# Patient Record
Sex: Male | Born: 1985 | Race: Black or African American | Hispanic: No | Marital: Single | Smoking: Current every day smoker
Health system: Southern US, Community
[De-identification: ages and names within clinical notes are randomized; demographics above are authoritative.]

## PROBLEM LIST (undated history)

## (undated) ENCOUNTER — Ambulatory Visit (HOSPITAL_COMMUNITY): Admission: EM

---

## 2011-10-24 ENCOUNTER — Emergency Department (HOSPITAL_COMMUNITY)
Admission: EM | Admit: 2011-10-24 | Discharge: 2011-10-24 | Disposition: A | Discharge status: Home / Self Care | Payer: Self-pay | Attending: Emergency Medicine | Admitting: Emergency Medicine

## 2011-10-24 ENCOUNTER — Encounter (HOSPITAL_COMMUNITY): Payer: Self-pay | Admitting: Emergency Medicine

## 2011-10-24 DIAGNOSIS — R369 Urethral discharge, unspecified: Secondary | ICD-10-CM | POA: Insufficient documentation

## 2011-10-24 DIAGNOSIS — R300 Dysuria: Secondary | ICD-10-CM | POA: Insufficient documentation

## 2011-10-24 DIAGNOSIS — R109 Unspecified abdominal pain: Secondary | ICD-10-CM | POA: Insufficient documentation

## 2011-10-24 LAB — URINALYSIS, ROUTINE W REFLEX MICROSCOPIC
Bilirubin Urine: NEGATIVE
Ketones, ur: NEGATIVE mg/dL
Nitrite: NEGATIVE
Protein, ur: NEGATIVE mg/dL
Urobilinogen, UA: 1 mg/dL (ref 0.0–1.0)

## 2011-10-24 LAB — URINE MICROSCOPIC-ADD ON

## 2011-10-24 MED ORDER — CEFTRIAXONE SODIUM 250 MG IJ SOLR
250.0000 mg | Freq: Once | INTRAMUSCULAR | Status: AC
  Administered 2011-10-24: 250 mg via INTRAMUSCULAR
  Filled 2011-10-24: qty 250

## 2011-10-24 MED ORDER — AZITHROMYCIN 250 MG PO TABS
1000.0000 mg | ORAL_TABLET | Freq: Once | ORAL | Status: AC
  Administered 2011-10-24: 1000 mg via ORAL
  Filled 2011-10-24: qty 4

## 2011-10-24 NOTE — ED Provider Notes (Signed)
History     CSN: [id number]  Arrival date & time 10/24/11  0302   First MD Initiated Contact with Patient 10/24/11 0345      Chief Complaint  Patient presents with  . Groin Pain    (Consider location/radiation/quality/duration/timing/severity/associated sxs/prior treatment) The history is provided by the patient.   chief complaint is penile discharge. Onset yesterday. Patient is sexually active with multiple partners and has recently had unprotected sex. He denies any rash. He denies any testicle pain. He denies any groin pain. He denies any history of STDs. He has had some dysuria today but no blood in his urine. No joint pain or sore throat. No chest pain, shortness of breath or cough. No abdominal pain or back pain. No fevers or chills. No urgency or frequency. Moderate in severity. No pain or radiation. Intermittent symptoms continuous since onset  History reviewed. No pertinent past medical history.  History reviewed. No pertinent past surgical history.  History reviewed. No pertinent family history.  History  Substance Use Topics  . Smoking status: Current Everyday Smoker  . Smokeless tobacco: Not on file  . Alcohol Use:       Review of Systems  Constitutional: Negative for fever and chills.  HENT: Negative for neck pain and neck stiffness.   Eyes: Negative for pain.  Respiratory: Negative for shortness of breath.   Cardiovascular: Negative for chest pain, palpitations and leg swelling.  Gastrointestinal: Negative for abdominal pain.  Genitourinary: Positive for discharge. Negative for dysuria, frequency, hematuria, flank pain, scrotal swelling, penile pain and testicular pain.  Musculoskeletal: Negative for back pain.  Skin: Negative for rash.  Neurological: Negative for headaches.  All other systems reviewed and are negative.    Allergies  Review of patient's allergies indicates no known allergies.  Home Medications  No current outpatient prescriptions on  file.  BP 156/95  Pulse 73  Temp(Src) 97.6 F (36.4 C) (Oral)  Resp 20  SpO2 99%  Physical Exam  Constitutional: He is oriented to person, place, and time. He appears well-developed and well-nourished.  HENT:  Head: Normocephalic and atraumatic.  Eyes: Conjunctivae and EOM are normal. Pupils are equal, round, and reactive to light.  Neck: Trachea normal. Neck supple. No thyromegaly present.  Cardiovascular: Normal rate, regular rhythm, S1 normal, S2 normal and normal pulses.     No systolic murmur is present   No diastolic murmur is present  Pulses:      Radial pulses are 2+ on the right side, and 2+ on the left side.  Pulmonary/Chest: Effort normal and breath sounds normal. He has no wheezes. He has no rhonchi. He has no rales. He exhibits no tenderness.  Abdominal: Soft. Normal appearance and bowel sounds are normal. There is no tenderness. There is no CVA tenderness and negative Murphy's sign.  Genitourinary: Penis normal. No penile tenderness.       No rash or lesions. No testicle tenderness. No swelling. Circumcised.  Musculoskeletal:       BLE:s Calves nontender, no cords or erythema, negative Homans sign  Neurological: He is alert and oriented to person, place, and time. He has normal strength. No cranial nerve deficit or sensory deficit. GCS eye subscore is 4. GCS verbal subscore is 5. GCS motor subscore is 6.  Skin: Skin is warm and dry. No rash noted. He is not diaphoretic.  Psychiatric: His speech is normal.       Cooperative and appropriate    ED Course  Procedures (including critical care  time)  GC and Chlamydia probe sent. RPR. IM Rocephin. Azithromycin 1 g by mouth.  1. Penile discharge    Safe sex precautions verbalized is understood.   MDM   Penile discharge likely STD. Plan followup with health department 2 days. Patient agrees to notify all recent sexual contacts to be tested. Still for discharge home. All questions answered.        Sunnie Nielsen,  MD 10/24/11 [phone]

## 2011-10-24 NOTE — ED Notes (Signed)
Pt sts he has some pain when he urinates and has noted some discharge as well. Pt sts he noted this yesterday. Pt had unprotected sex over the weekend. Pt denies any abd pain or n/v/d.

## 2011-10-26 NOTE — ED Notes (Addendum)
+   Gonorrhea. Treated with Rocephin and Zithromax. Per protocol MD. [facility] faxed.

## 2011-10-27 NOTE — ED Notes (Signed)
Called and informed patient. 

## 2012-02-28 ENCOUNTER — Emergency Department (HOSPITAL_COMMUNITY)
Admission: EM | Admit: 2012-02-28 | Discharge: 2012-02-28 | Disposition: A | Discharge status: Home / Self Care | Payer: Self-pay | Attending: Emergency Medicine | Admitting: Emergency Medicine

## 2012-02-28 ENCOUNTER — Encounter (HOSPITAL_COMMUNITY): Payer: Self-pay | Admitting: Emergency Medicine

## 2012-02-28 ENCOUNTER — Emergency Department (HOSPITAL_COMMUNITY): Payer: Self-pay

## 2012-02-28 DIAGNOSIS — N453 Epididymo-orchitis: Secondary | ICD-10-CM | POA: Insufficient documentation

## 2012-02-28 DIAGNOSIS — N451 Epididymitis: Secondary | ICD-10-CM

## 2012-02-28 DIAGNOSIS — A64 Unspecified sexually transmitted disease: Secondary | ICD-10-CM | POA: Insufficient documentation

## 2012-02-28 LAB — SYPHILIS: RPR W/REFLEX TO RPR TITER AND TREPONEMAL ANTIBODIES, TRADITIONAL SCREENING AND DIAGNOSIS ALGORITHM: RPR Ser Ql: NONREACTIVE

## 2012-02-28 MED ORDER — AZITHROMYCIN 250 MG PO TABS
1000.0000 mg | ORAL_TABLET | Freq: Once | ORAL | Status: AC
  Administered 2012-02-28: 1000 mg via ORAL
  Filled 2012-02-28: qty 1

## 2012-02-28 MED ORDER — DOXYCYCLINE HYCLATE 100 MG PO CAPS: 100.0000 mg | ORAL_CAPSULE | Freq: Two times a day (BID) | ORAL | Status: AC

## 2012-02-28 MED ORDER — LIDOCAINE HCL (PF) 1 % IJ SOLN
INTRAMUSCULAR | Status: AC
  Administered 2012-02-28: 04:00:00
  Filled 2012-02-28: qty 5

## 2012-02-28 MED ORDER — CEFTRIAXONE SODIUM 250 MG IJ SOLR
250.0000 mg | Freq: Once | INTRAMUSCULAR | Status: AC
  Administered 2012-02-28: 250 mg via INTRAMUSCULAR
  Filled 2012-02-28: qty 250

## 2012-02-28 NOTE — ED Provider Notes (Signed)
History     CSN: [id number]  Arrival date & time 02/28/12  0228   First MD Initiated Contact with Patient 02/28/12 0234      Chief Complaint  Patient presents with  . Penile Discharge    (Consider location/radiation/quality/duration/timing/severity/associated sxs/prior treatment) HPI History provided by patient. Having penile discharge with some testicular discomfort. States sexual partner was diagnosed with gonorrhea. No history of same. No rash. No joint pains. No sore throat. No fevers or chills. No history of diabetes or medical problems otherwise. Symptoms moderate in severity. No dysuria urgency or frequency. History reviewed. No pertinent past medical history.  History reviewed. No pertinent past surgical history.  No family history on file.  History  Substance Use Topics  . Smoking status: Current Everyday Smoker  . Smokeless tobacco: Not on file  . Alcohol Use: Yes      Review of Systems  Constitutional: Negative for fever and chills.  HENT: Negative for neck pain and neck stiffness.   Eyes: Negative for pain.  Respiratory: Negative for shortness of breath.   Cardiovascular: Negative for chest pain.  Gastrointestinal: Negative for abdominal pain.  Genitourinary: Positive for discharge and testicular pain. Negative for dysuria and flank pain.  Musculoskeletal: Negative for back pain.  Skin: Negative for rash.  Neurological: Negative for headaches.  All other systems reviewed and are negative.    Allergies  Review of patient's allergies indicates no known allergies.  Home Medications  No current outpatient prescriptions on file.  BP 163/95  Pulse 71  Temp(Src) 98.5 F (36.9 C) (Oral)  Resp 16  SpO2 100%  Physical Exam  Constitutional: He is oriented to person, place, and time. He appears well-developed and well-nourished.  HENT:  Head: Normocephalic and atraumatic.  Eyes: Conjunctivae and EOM are normal. Pupils are equal, round, and reactive to  light.  Neck: Trachea normal. Neck supple. No thyromegaly present.  Cardiovascular: Normal rate, regular rhythm, S1 normal, S2 normal and normal pulses.     No systolic murmur is present   No diastolic murmur is present  Pulses:      Radial pulses are 2+ on the right side, and 2+ on the left side.  Pulmonary/Chest: Effort normal and breath sounds normal. He has no wheezes. He has no rhonchi. He has no rales. He exhibits no tenderness.  Abdominal: Soft. Normal appearance and bowel sounds are normal. There is no tenderness. There is no CVA tenderness and negative Murphy's sign.  Genitourinary:       Penile discharge. No rash. No lesions. No testicular fullness or tenderness  Musculoskeletal:       BLE:s Calves nontender, no cords or erythema, negative Homans sign  Neurological: He is alert and oriented to person, place, and time. He has normal strength. No cranial nerve deficit or sensory deficit. GCS eye subscore is 4. GCS verbal subscore is 5. GCS motor subscore is 6.  Skin: Skin is warm and dry. No rash noted. He is not diaphoretic.  Psychiatric: His speech is normal.       Cooperative and appropriate    ED Course  Procedures (including critical care time)  Results for orders placed during the hospital encounter of 02/28/12  RPR      Component Value Range   RPR NON REACTIVE  NON REACTIVE    US Scrotum  02/28/2012  *RADIOLOGY REPORT*  Clinical Data: Left testicular pain for 1 day.  ULTRASOUND OF SCROTUM  Technique:  Complete ultrasound examination of the testicles, epididymis, and  other scrotal structures was performed.  Comparison:  None.  Findings:  Right testis:  The right testis measures 4.3 x 2.9 x 3.1 cm.  No focal testicular mass lesions. There is normal homogeneous color flow.  Arterial and venous spectral flow velocity waveforms are obtained.  Left testis:  Left testis measures 4 x 2.7 x 3.2 cm.  No focal testicular mass lesions.  Normal homogeneous color flow.  Arterial and  venous spectral flow velocity waveforms are obtained.  Right epididymis:  Small epididymal cyst measuring 6 mm diameter. Normal epididymal size and flow.  Left epididymis:  Epididymal cyst measuring about 1 cm diameter. The tail of the epididymis is enlarged and heterogeneous in appearance with increased flow.  This may represent focal epididymitis or focal trauma.  Hydrocele:  No significant hydroceles.  Varicocele:  No significant varicoceles.  IMPRESSION: Normal appearance of the testes without evidence of mass, torsion, or inflammation.  Small bilateral epididymal cysts.  Heterogeneous thickening of the distal left epididymis with increased flow which might represent focal epididymitis or possibly post-traumatic change.  Original Report Authenticated By: Marlon Pel, M.D.      MDM   Penile discharge with STD exposure gonorrhea. Treated for same. IM Rocephin and by mouth azithromycin. Referred to health department for followup of RPR testing and further evaluation. STD precautions verbalized as understood.   Doxycycline provided for epididymitis.     Sunnie Nielsen, MD 02/29/12 [phone]
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
## 2012-02-28 NOTE — Discharge Instructions (Signed)
Sexually Transmitted Disease  Sexually transmitted disease (STD) refers to any infection that is passed from person to person during sexual activity. This may happen by way of saliva, semen, blood, vaginal mucus, or urine. Common STDs include:  Gonorrhea.  Chlamydia.  Syphilis.  HIV/AIDS.  Genital herpes.  Hepatitis B and C.  Trichomonas.  Human papillomavirus (HPV).  Pubic lice.  CAUSES  An STD may be spread by bacteria, virus, or parasite. A person can get an STD by:  Sexual intercourse with an infected person.  Sharing sex toys with an infected person.  Sharing needles with an infected person.  Having intimate contact with the genitals, mouth, or rectal areas of an infected person.  SYMPTOMS  Some people may not have any symptoms, but they can still pass the infection to others. Different STDs have different symptoms. Symptoms include:  Painful or bloody urination.  Pain in the pelvis, abdomen, vagina, anus, throat, or eyes.  Skin rash, itching, irritation, growths, or sores (lesions). These usually occur in the genital or anal area.  Abnormal vaginal discharge.  Penile discharge in men.  Soft, flesh-colored skin growths in the genital or anal area.  Fever.  Pain or bleeding during sexual intercourse.  Swollen glands in the groin area.  Yellow skin and eyes (jaundice). This is seen with hepatitis.  DIAGNOSIS  To make a diagnosis, your caregiver may:  Take a medical history.  Perform a physical exam.  Take a specimen (culture) to be examined.  Examine a sample of discharge under a microscope.  Perform blood tests.  Perform a Pap test, if this applies.  Perform a colposcopy.  Perform a laparoscopy.  TREATMENT  Chlamydia, gonorrhea, trichomonas, and syphilis can be cured with antibiotic medicine.  Genital herpes, hepatitis, and HIV can be treated, but not cured, with prescribed medicines. The medicines will lessen the symptoms.  Genital warts from HPV can be treated with  medicine or by freezing, burning (electrocautery), or surgery. Warts may come back.  HPV is a virus and cannot be cured with medicine or surgery. However, abnormal areas may be followed very closely by your caregiver and may be removed from the cervix, vagina, or vulva through office procedures or surgery.  If your diagnosis is confirmed, your recent sexual partners need treatment. This is true even if they are symptom-free or have a negative culture or evaluation. They should not have sex until their caregiver says it is okay.  HOME CARE INSTRUCTIONS  All sexual partners should be informed, tested, and treated for all STDs.  Take your antibiotics as directed. Finish them even if you start to feel better.  Only take over-the-counter or prescription medicines for pain, discomfort, or fever as directed by your caregiver.  Rest.  Eat a balanced diet and drink enough fluids to keep your urine clear or pale yellow.  Do not have sex until treatment is completed and you have followed up with your caregiver. STDs should be checked after treatment.  Keep all follow-up appointments, Pap tests, and blood tests as directed by your caregiver.  Only use latex condoms and water-soluble lubricants during sexual activity. Do not use petroleum jelly or oils.  Avoid alcohol and illegal drugs.  Get vaccinated for HPV and hepatitis. If you have not received these vaccines in the past, talk to your caregiver about whether one or both might be right for you.  Avoid risky sex practices that can break the skin.  The only way to avoid getting an STD is  to avoid all sexual activity. Latex condoms and dental dams (for oral sex) will help lessen the risk of getting an STD, but will not completely eliminate the risk.  SEEK MEDICAL CARE IF:  You have a fever.  You have any new or worsening symptoms. Epididymitis Epididymitis is a swelling (inflammation) of the epididymis. The epididymis is a cord-like structure along the back  part of the testicle. Epididymitis is usually, but not always, caused by infection. This is usually a sudden problem beginning with chills, fever and pain behind the scrotum and in the testicle. There may be swelling and redness of the testicle. DIAGNOSIS  Physical examination will reveal a tender, swollen epididymis. Sometimes, cultures are obtained from the urine or from prostate secretions to help find out if there is an infection or if the cause is a different problem. Sometimes, blood work is performed to see if your white blood cell count is elevated and if a germ (bacterial) or viral infection is present. Using this knowledge, an appropriate medicine which kills germs (antibiotic) can be chosen by your caregiver. A viral infection causing epididymitis will most often go away (resolve) without treatment. HOME CARE INSTRUCTIONS  Hot sitz baths for 20 minutes, 4 times per day, may help relieve pain.  Only take over-the-counter or prescription medicines for pain, discomfort or fever as directed by your caregiver.  Take all medicines, including antibiotics, as directed. Take the antibiotics for the full prescribed length of time even if you are feeling better.  It is very important to keep all follow-up appointments.  SEEK IMMEDIATE MEDICAL CARE IF:  You have a fever.  You have pain not relieved with medicines.  You have any worsening of your problems.  Your pain seems to come and go.  You develop pain, redness, and swelling in the scrotum and surrounding areas.  MAKE SURE YOU:  Understand these instructions.  Will watch your condition.  Will get help right away if you are not doing well or get worse.  Document Released: 09/13/2000 Document Revised: 09/05/2011 Document Reviewed: 08/03/2009 [facility] Patient Information 2012 [place], [place].

## 2012-03-02 LAB — GC/CHLAMYDIA PROBE AMP, GENITAL
Chlamydia, DNA Probe: NEGATIVE
GC Probe Amp, Genital: POSITIVE — AB

## 2012-03-03 NOTE — ED Notes (Signed)
+   Gonorrhea Patient treated with Rocephin and Zithromax/Doxycycline-[facility] letter faxed

## 2012-03-07 NOTE — ED Notes (Signed)
Left voicemail for patient to call back. 

## 2012-03-08 NOTE — ED Notes (Signed)
Attempted to call patient x2. Left voicemail for patient to call back. Sent letter after no answer x3.

## 2013-10-28 ENCOUNTER — Emergency Department (HOSPITAL_COMMUNITY): Payer: Self-pay | Admitting: Anesthesiology

## 2013-10-28 ENCOUNTER — Encounter (HOSPITAL_COMMUNITY): Payer: Self-pay | Admitting: Anesthesiology

## 2013-10-28 ENCOUNTER — Emergency Department (HOSPITAL_COMMUNITY): Payer: Self-pay

## 2013-10-28 ENCOUNTER — Encounter (HOSPITAL_COMMUNITY): Payer: Self-pay | Admitting: Emergency Medicine

## 2013-10-28 ENCOUNTER — Encounter (HOSPITAL_COMMUNITY)
Admission: EM | Disposition: A | Discharge status: Home / Self Care | Payer: Self-pay | Source: Home / Self Care | Attending: Emergency Medicine

## 2013-10-28 ENCOUNTER — Ambulatory Visit (HOSPITAL_COMMUNITY)
Admission: EM | Admit: 2013-10-28 | Discharge: 2013-10-28 | Disposition: A | Discharge status: Home / Self Care | Payer: Self-pay | Attending: Emergency Medicine | Admitting: Emergency Medicine

## 2013-10-28 DIAGNOSIS — L089 Local infection of the skin and subcutaneous tissue, unspecified: Secondary | ICD-10-CM

## 2013-10-28 DIAGNOSIS — S61411A Laceration without foreign body of right hand, initial encounter: Secondary | ICD-10-CM

## 2013-10-28 DIAGNOSIS — F17200 Nicotine dependence, unspecified, uncomplicated: Secondary | ICD-10-CM | POA: Insufficient documentation

## 2013-10-28 DIAGNOSIS — S61209A Unspecified open wound of unspecified finger without damage to nail, initial encounter: Secondary | ICD-10-CM | POA: Insufficient documentation

## 2013-10-28 HISTORY — PX: I & D EXTREMITY: SHX5045

## 2013-10-28 LAB — CBC
HCT: 46 % (ref 39.0–52.0)
HEMOGLOBIN: 16.7 g/dL (ref 13.0–17.0)
MCH: 33.1 pg (ref 26.0–34.0)
MCHC: 36.3 g/dL — ABNORMAL HIGH (ref 30.0–36.0)
MCV: 91.1 fL (ref 78.0–100.0)
Platelets: 235 10*3/uL (ref 150–400)
RBC: 5.05 MIL/uL (ref 4.22–5.81)
RDW: 12.3 % (ref 11.5–15.5)
WBC: 6.7 10*3/uL (ref 4.0–10.5)

## 2013-10-28 LAB — BASIC METABOLIC PANEL
BUN: 18 mg/dL (ref 6–23)
CALCIUM: 10 mg/dL (ref 8.4–10.5)
CHLORIDE: 100 meq/L (ref 96–112)
CO2: 26 meq/L (ref 19–32)
CREATININE: 1.25 mg/dL (ref 0.50–1.35)
GFR calc Af Amer: >90 mL/min (ref >90)
GFR calc non Af Amer: 79 mL/min — ABNORMAL LOW (ref >90)
GLUCOSE: 99 mg/dL (ref 70–99)
Potassium: 4.2 mEq/L (ref 3.7–5.3)
Sodium: 142 mEq/L (ref 137–147)

## 2013-10-28 SURGERY — IRRIGATION AND DEBRIDEMENT EXTREMITY
Anesthesia: General | Site: Hand | Laterality: Right

## 2013-10-28 MED ORDER — BSS IO SOLN
INTRAOCULAR | Status: AC
  Filled 2013-10-28: qty 30

## 2013-10-28 MED ORDER — FENTANYL CITRATE 0.05 MG/ML IJ SOLN
INTRAMUSCULAR | Status: DC | PRN
  Administered 2013-10-28: 100 ug via INTRAVENOUS
  Administered 2013-10-28 (×3): 50 ug via INTRAVENOUS

## 2013-10-28 MED ORDER — PROMETHAZINE HCL 25 MG/ML IJ SOLN: 6.2500 mg | INTRAMUSCULAR | Status: DC | PRN

## 2013-10-28 MED ORDER — OXYCODONE HCL 5 MG PO TABS
5.0000 mg | ORAL_TABLET | Freq: Once | ORAL | Status: AC | PRN
  Administered 2013-10-28: 5 mg via ORAL

## 2013-10-28 MED ORDER — OXYCODONE HCL 5 MG PO TABS
ORAL_TABLET | ORAL | Status: AC
  Filled 2013-10-28: qty 1

## 2013-10-28 MED ORDER — DEXAMETHASONE SODIUM PHOSPHATE 4 MG/ML IJ SOLN
INTRAMUSCULAR | Status: DC | PRN
  Administered 2013-10-28: 8 mg via INTRAVENOUS

## 2013-10-28 MED ORDER — OXYCODONE HCL 5 MG/5ML PO SOLN: 5.0000 mg | Freq: Once | ORAL | Status: AC | PRN

## 2013-10-28 MED ORDER — PROPOFOL 10 MG/ML IV BOLUS
INTRAVENOUS | Status: AC
  Filled 2013-10-28: qty 20

## 2013-10-28 MED ORDER — OXYCODONE-ACETAMINOPHEN 5-325 MG PO TABS: ORAL_TABLET | ORAL | Status: DC

## 2013-10-28 MED ORDER — AMOXICILLIN-POT CLAVULANATE 875-125 MG PO TABS: 1.0000 | ORAL_TABLET | Freq: Two times a day (BID) | ORAL | Status: DC

## 2013-10-28 MED ORDER — ONDANSETRON HCL 4 MG/2ML IJ SOLN
INTRAMUSCULAR | Status: AC
  Filled 2013-10-28: qty 2

## 2013-10-28 MED ORDER — SODIUM CHLORIDE 0.9 % IV SOLN
INTRAVENOUS | Status: DC | PRN
  Administered 2013-10-28: 21:00:00 via INTRAVENOUS

## 2013-10-28 MED ORDER — MIDAZOLAM HCL 2 MG/2ML IJ SOLN
INTRAMUSCULAR | Status: AC
  Filled 2013-10-28: qty 2

## 2013-10-28 MED ORDER — HYDROMORPHONE HCL PF 1 MG/ML IJ SOLN
0.2500 mg | INTRAMUSCULAR | Status: DC | PRN
  Administered 2013-10-28: 0.5 mg via INTRAVENOUS

## 2013-10-28 MED ORDER — FENTANYL CITRATE 0.05 MG/ML IJ SOLN
INTRAMUSCULAR | Status: AC
  Filled 2013-10-28: qty 5

## 2013-10-28 MED ORDER — MIDAZOLAM HCL 5 MG/5ML IJ SOLN
INTRAMUSCULAR | Status: DC | PRN
  Administered 2013-10-28: 2 mg via INTRAVENOUS

## 2013-10-28 MED ORDER — BUPIVACAINE HCL (PF) 0.25 % IJ SOLN
INTRAMUSCULAR | Status: DC | PRN
  Administered 2013-10-28: 8 mL

## 2013-10-28 MED ORDER — LACTATED RINGERS IV SOLN
INTRAVENOUS | Status: DC | PRN
  Administered 2013-10-28: 20:00:00 via INTRAVENOUS

## 2013-10-28 MED ORDER — DEXAMETHASONE SODIUM PHOSPHATE 4 MG/ML IJ SOLN
INTRAMUSCULAR | Status: AC
  Filled 2013-10-28: qty 2

## 2013-10-28 MED ORDER — LIDOCAINE HCL (CARDIAC) 20 MG/ML IV SOLN
INTRAVENOUS | Status: DC | PRN
  Administered 2013-10-28: 100 mg via INTRAVENOUS

## 2013-10-28 MED ORDER — ONDANSETRON HCL 4 MG/2ML IJ SOLN
INTRAMUSCULAR | Status: DC | PRN
Start: 2013-10-28 — End: 2013-10-29
  Administered 2013-10-28: 4 mg via INTRAVENOUS

## 2013-10-28 MED ORDER — HYDROMORPHONE HCL PF 1 MG/ML IJ SOLN
INTRAMUSCULAR | Status: AC
  Filled 2013-10-28: qty 1

## 2013-10-28 MED ORDER — ARTIFICIAL TEARS OP OINT
TOPICAL_OINTMENT | OPHTHALMIC | Status: DC | PRN
  Administered 2013-10-28: 1 via OPHTHALMIC

## 2013-10-28 MED ORDER — STERILE WATER FOR INJECTION IJ SOLN
INTRAMUSCULAR | Status: AC
  Filled 2013-10-28: qty 10

## 2013-10-28 MED ORDER — PROPOFOL 10 MG/ML IV BOLUS
INTRAVENOUS | Status: DC | PRN
  Administered 2013-10-28: 200 mg via INTRAVENOUS

## 2013-10-28 MED ORDER — SUCCINYLCHOLINE CHLORIDE 20 MG/ML IJ SOLN
INTRAMUSCULAR | Status: AC
  Filled 2013-10-28: qty 1

## 2013-10-28 MED ORDER — BUPIVACAINE HCL (PF) 0.25 % IJ SOLN
INTRAMUSCULAR | Status: AC
  Filled 2013-10-28: qty 30

## 2013-10-28 MED ORDER — VECURONIUM BROMIDE 10 MG IV SOLR
INTRAVENOUS | Status: AC
  Filled 2013-10-28: qty 10

## 2013-10-28 MED ORDER — SODIUM CHLORIDE 0.9 % IV SOLN
3.0000 g | INTRAVENOUS | Status: AC
  Administered 2013-10-28: 3 g via INTRAVENOUS
  Filled 2013-10-28: qty 3

## 2013-10-28 MED ORDER — ARTIFICIAL TEARS OP OINT
TOPICAL_OINTMENT | OPHTHALMIC | Status: AC
  Filled 2013-10-28: qty 3.5

## 2013-10-28 MED ORDER — CLINDAMYCIN HCL 150 MG PO CAPS: 600.0000 mg | ORAL_CAPSULE | Freq: Once | ORAL | Status: DC

## 2013-10-28 MED ORDER — SODIUM CHLORIDE 0.9 % IR SOLN
Status: DC | PRN
  Administered 2013-10-28: 3000 mL

## 2013-10-28 SURGICAL SUPPLY — 51 items
BANDAGE COBAN STERILE 2 (GAUZE/BANDAGES/DRESSINGS) IMPLANT
BANDAGE CONFORM 2  STR LF (GAUZE/BANDAGES/DRESSINGS) IMPLANT
BANDAGE ELASTIC 3 VELCRO ST LF (GAUZE/BANDAGES/DRESSINGS) ×3 IMPLANT
BANDAGE ELASTIC 4 VELCRO ST LF (GAUZE/BANDAGES/DRESSINGS) IMPLANT
BANDAGE GAUZE ELAST BULKY 4 IN (GAUZE/BANDAGES/DRESSINGS) ×3 IMPLANT
BNDG COHESIVE 1X5 TAN STRL LF (GAUZE/BANDAGES/DRESSINGS) IMPLANT
BNDG ESMARK 4X9 LF (GAUZE/BANDAGES/DRESSINGS) ×3 IMPLANT
CLOTH BEACON ORANGE TIMEOUT ST (SAFETY) ×3 IMPLANT
CORDS BIPOLAR (ELECTRODE) ×3 IMPLANT
COVER SURGICAL LIGHT HANDLE (MISCELLANEOUS) ×3 IMPLANT
DECANTER SPIKE VIAL GLASS SM (MISCELLANEOUS) IMPLANT
DRAIN PENROSE 1/4X12 LTX STRL (WOUND CARE) IMPLANT
DRSG ADAPTIC 3X8 NADH LF (GAUZE/BANDAGES/DRESSINGS) IMPLANT
DRSG EMULSION OIL 3X3 NADH (GAUZE/BANDAGES/DRESSINGS) IMPLANT
DRSG PAD ABDOMINAL 8X10 ST (GAUZE/BANDAGES/DRESSINGS) IMPLANT
GAUZE PACKING IODOFORM 1/4X5 (PACKING) ×3 IMPLANT
GAUZE XEROFORM 1X8 LF (GAUZE/BANDAGES/DRESSINGS) IMPLANT
GLOVE BIO SURGEON STRL SZ7.5 (GLOVE) ×3 IMPLANT
GLOVE BIOGEL PI IND STRL 8 (GLOVE) ×1 IMPLANT
GLOVE BIOGEL PI INDICATOR 8 (GLOVE) ×2
GOWN BRE IMP PREV XXLGXLNG (GOWN DISPOSABLE) ×3 IMPLANT
HANDPIECE INTERPULSE COAX TIP (DISPOSABLE)
KIT BASIN OR (CUSTOM PROCEDURE TRAY) ×3 IMPLANT
KIT ROOM TURNOVER OR (KITS) ×3 IMPLANT
LOOP VESSEL MAXI BLUE (MISCELLANEOUS) ×3 IMPLANT
LOOP VESSEL MINI RED (MISCELLANEOUS) IMPLANT
MANIFOLD NEPTUNE II (INSTRUMENTS) ×3 IMPLANT
NEEDLE HYPO 25X1 1.5 SAFETY (NEEDLE) IMPLANT
NS IRRIG 1000ML POUR BTL (IV SOLUTION) ×3 IMPLANT
PACK ORTHO EXTREMITY (CUSTOM PROCEDURE TRAY) ×3 IMPLANT
PAD ARMBOARD 7.5X6 YLW CONV (MISCELLANEOUS) ×6 IMPLANT
SCRUB BETADINE 4OZ XXX (MISCELLANEOUS) ×3 IMPLANT
SET HNDPC FAN SPRY TIP SCT (DISPOSABLE) IMPLANT
SOLUTION BETADINE 4OZ (MISCELLANEOUS) ×3 IMPLANT
SPLINT FIBERGLASS 3X12 (CAST SUPPLIES) ×3 IMPLANT
SPONGE GAUZE 4X4 12PLY (GAUZE/BANDAGES/DRESSINGS) IMPLANT
SPONGE LAP 18X18 X RAY DECT (DISPOSABLE) ×3 IMPLANT
SPONGE LAP 4X18 X RAY DECT (DISPOSABLE) IMPLANT
SUCTION FRAZIER TIP 10 FR DISP (SUCTIONS) IMPLANT
SUT ETHILON 4 0 PS 2 18 (SUTURE) IMPLANT
SUT MON AB 5-0 P3 18 (SUTURE) IMPLANT
SYR CONTROL 10ML LL (SYRINGE) IMPLANT
TOWEL OR 17X24 6PK STRL BLUE (TOWEL DISPOSABLE) ×3 IMPLANT
TOWEL OR 17X26 10 PK STRL BLUE (TOWEL DISPOSABLE) ×3 IMPLANT
TUBE ANAEROBIC SPECIMEN COL (MISCELLANEOUS) ×3 IMPLANT
TUBE CONNECTING 12'X1/4 (SUCTIONS) ×1
TUBE CONNECTING 12X1/4 (SUCTIONS) ×2 IMPLANT
TUBE FEEDING 5FR 15 INCH (TUBING) IMPLANT
UNDERPAD 30X30 INCONTINENT (UNDERPADS AND DIAPERS) ×3 IMPLANT
WATER STERILE IRR 1000ML POUR (IV SOLUTION) ×3 IMPLANT
YANKAUER SUCT BULB TIP NO VENT (SUCTIONS) ×3 IMPLANT
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
## 2013-10-28 NOTE — ED Notes (Signed)
Patient transported to x-ray. ?

## 2013-10-28 NOTE — Discharge Instructions (Signed)

## 2013-10-28 NOTE — ED Notes (Signed)
Hand Surgeon at bedside.
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
## 2013-10-28 NOTE — ED Notes (Signed)
Jewelry and belongings placed in labeled pt belongings bag.
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
## 2013-10-28 NOTE — Anesthesia Procedure Notes (Signed)
Procedure Name: LMA Insertion Date/Time: 10/28/2013 8:49 PM Performed by: Wray KearnsFOLEY, Seirra Kos A Pre-anesthesia Checklist: Patient identified, Timeout performed, Emergency Drugs available, Suction available and Patient being monitored Patient Re-evaluated:Patient Re-evaluated prior to inductionOxygen Delivery Method: Circle system utilized Preoxygenation: Pre-oxygenation with 100% oxygen Intubation Type: IV induction Ventilation: Mask ventilation without difficulty LMA: LMA inserted LMA Size: 5.0 Tube type: Oral Number of attempts: 1 Placement Confirmation: breath sounds checked- equal and bilateral and positive ETCO2 Tube secured with: Tape Dental Injury: Teeth and Oropharynx as per pre-operative assessment
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
## 2013-10-28 NOTE — OR Nursing (Signed)
Blue vessel loops to right hand as drain

## 2013-10-28 NOTE — Op Note (Signed)
[id number] 

## 2013-10-28 NOTE — ED Notes (Signed)
Antibiotic to be held until Hydrographic surveyorhand surgeon evaluates.
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
## 2013-10-28 NOTE — ED Notes (Signed)
Pt instructed to not have anything to eat or drink.

## 2013-10-28 NOTE — Anesthesia Preprocedure Evaluation (Signed)
Anesthesia Evaluation  Patient identified by MRN, date of birth, ID band Patient awake    Reviewed: Allergy & Precautions, H&P , NPO status , Patient's Chart, lab work & pertinent test results  Airway Mallampati: I TM Distance: >3 FB Neck ROM: Full    Dental   Pulmonary Current Smoker,  breath sounds clear to auscultation        Cardiovascular Rhythm:Regular Rate:Normal     Neuro/Psych    GI/Hepatic   Endo/Other    Renal/GU      Musculoskeletal   Abdominal   Peds  Hematology   Anesthesia Other Findings   Reproductive/Obstetrics                           Anesthesia Physical Anesthesia Plan  ASA: I and emergent  Anesthesia Plan: General   Post-op Pain Management:    Induction: Intravenous  Airway Management Planned: LMA  Additional Equipment:   Intra-op Plan:   Post-operative Plan: Extubation in OR  Informed Consent: I have reviewed the patients History and Physical, chart, labs and discussed the procedure including the risks, benefits and alternatives for the proposed anesthesia with the patient or authorized representative who has indicated his/her understanding and acceptance.     Plan Discussed with: CRNA and Surgeon  Anesthesia Plan Comments:         Anesthesia Quick Evaluation

## 2013-10-28 NOTE — Preoperative (Signed)
Beta Blockers   Reason not to administer Beta Blockers:Not Applicable 

## 2013-10-29 ENCOUNTER — Encounter (HOSPITAL_COMMUNITY): Payer: Self-pay | Admitting: Orthopedic Surgery
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
## 2013-10-31 LAB — WOUND CULTURE: Culture: NO GROWTH

## 2013-11-02 LAB — ANAEROBIC CULTURE

## 2013-11-03 ENCOUNTER — Emergency Department (HOSPITAL_COMMUNITY)
Admission: EM | Admit: 2013-11-03 | Discharge: 2013-11-03 | Disposition: A | Discharge status: Home / Self Care | Payer: MEDICAID | Attending: Emergency Medicine | Admitting: Emergency Medicine

## 2013-11-03 ENCOUNTER — Encounter (HOSPITAL_COMMUNITY): Payer: Self-pay | Admitting: Emergency Medicine

## 2013-11-03 DIAGNOSIS — M25549 Pain in joints of unspecified hand: Secondary | ICD-10-CM | POA: Insufficient documentation

## 2013-11-03 DIAGNOSIS — Z792 Long term (current) use of antibiotics: Secondary | ICD-10-CM | POA: Insufficient documentation

## 2013-11-03 DIAGNOSIS — M79641 Pain in right hand: Secondary | ICD-10-CM

## 2013-11-03 DIAGNOSIS — Z9889 Other specified postprocedural states: Secondary | ICD-10-CM | POA: Insufficient documentation

## 2013-11-03 DIAGNOSIS — F17200 Nicotine dependence, unspecified, uncomplicated: Secondary | ICD-10-CM | POA: Insufficient documentation

## 2013-11-03 MED ORDER — OXYCODONE-ACETAMINOPHEN 5-325 MG PO TABS
2.0000 | ORAL_TABLET | Freq: Once | ORAL | Status: AC
  Administered 2013-11-03: 2 via ORAL
  Filled 2013-11-03: qty 2
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
## 2013-11-03 NOTE — ED Notes (Signed)
PT ambulated with baseline gait; VSS; A&Ox3; no signs of distress; respirations even and unlabored; skin warm and dry; no questions upon discharge.  

## 2013-11-03 NOTE — ED Notes (Signed)
Patient presents stating he had right hand surgery last Thursday and missed his appointment and the office never called back to reschedule for today.  States the dressing is a mess and states his has to take 1 pain pill a day.  States the bone was infected

## 2013-11-03 NOTE — ED Notes (Addendum)
PT reports he hand surgery Thursday by Dr. Merlyn LotKuzma; reports pain in hand. No smell noted or excessive drainage. Pt's hand wrapped in post surgical gauze. Pt denies fevers or chills. Pt instructed to follow up with hand surgeon first thing in AM.

## 2013-11-03 NOTE — Discharge Instructions (Signed)
DO not eat or drink after midnight tonight. You need to call Dr. Merlyn LotKuzma first thing ing the morning to follow up. Return for follow up if you develop fever or chills.

## 2013-11-08 NOTE — ED Provider Notes (Signed)
Medical screening examination/treatment/procedure(s) were performed by non-physician practitioner and as supervising physician I was immediately available for consultation/collaboration.  EKG Interpretation   None        Olson Lucarelli R. Whitleigh Garramone, MD 11/08/13 2047 

## 2014-03-01 ENCOUNTER — Encounter (HOSPITAL_COMMUNITY): Payer: Self-pay | Admitting: Emergency Medicine

## 2014-03-01 ENCOUNTER — Emergency Department (HOSPITAL_COMMUNITY)
Admission: EM | Admit: 2014-03-01 | Discharge: 2014-03-01 | Disposition: A | Discharge status: Home / Self Care | Payer: MEDICAID | Attending: Emergency Medicine | Admitting: Emergency Medicine

## 2014-03-01 DIAGNOSIS — Y92410 Unspecified street and highway as the place of occurrence of the external cause: Secondary | ICD-10-CM | POA: Insufficient documentation

## 2014-03-01 DIAGNOSIS — Z792 Long term (current) use of antibiotics: Secondary | ICD-10-CM | POA: Insufficient documentation

## 2014-03-01 DIAGNOSIS — T148XXA Other injury of unspecified body region, initial encounter: Secondary | ICD-10-CM

## 2014-03-01 DIAGNOSIS — M542 Cervicalgia: Secondary | ICD-10-CM | POA: Insufficient documentation

## 2014-03-01 DIAGNOSIS — F17200 Nicotine dependence, unspecified, uncomplicated: Secondary | ICD-10-CM | POA: Insufficient documentation

## 2014-03-01 DIAGNOSIS — Z791 Long term (current) use of non-steroidal anti-inflammatories (NSAID): Secondary | ICD-10-CM | POA: Insufficient documentation

## 2014-03-01 DIAGNOSIS — Y9389 Activity, other specified: Secondary | ICD-10-CM | POA: Insufficient documentation

## 2014-03-01 DIAGNOSIS — IMO0002 Reserved for concepts with insufficient information to code with codable children: Secondary | ICD-10-CM | POA: Insufficient documentation

## 2014-03-01 MED ORDER — METHOCARBAMOL 500 MG PO TABS: 1000.0000 mg | ORAL_TABLET | Freq: Four times a day (QID) | ORAL | Status: DC

## 2014-03-01 MED ORDER — NAPROXEN 500 MG PO TABS: 500.0000 mg | ORAL_TABLET | Freq: Two times a day (BID) | ORAL | Status: DC

## 2014-03-01 NOTE — ED Provider Notes (Signed)
Medical screening examination/treatment/procedure(s) were performed by non-physician practitioner and as supervising physician I was immediately available for consultation/collaboration.  Dalon Reichart J. Jamiaya Bina, MD 03/01/14 2059 
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
## 2014-03-01 NOTE — ED Notes (Signed)
According to PA, pt walked out a few minutes ago.
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
## 2014-10-23 ENCOUNTER — Emergency Department (HOSPITAL_COMMUNITY)
Admission: EM | Admit: 2014-10-23 | Discharge: 2014-10-23 | Disposition: A | Discharge status: Home / Self Care | Payer: MEDICAID | Attending: Emergency Medicine | Admitting: Emergency Medicine

## 2014-10-23 ENCOUNTER — Encounter (HOSPITAL_COMMUNITY): Payer: Self-pay | Admitting: Emergency Medicine

## 2014-10-23 DIAGNOSIS — L989 Disorder of the skin and subcutaneous tissue, unspecified: Secondary | ICD-10-CM | POA: Insufficient documentation

## 2014-10-23 DIAGNOSIS — Z79899 Other long term (current) drug therapy: Secondary | ICD-10-CM | POA: Insufficient documentation

## 2014-10-23 DIAGNOSIS — Z720 Tobacco use: Secondary | ICD-10-CM | POA: Insufficient documentation

## 2014-10-23 DIAGNOSIS — R220 Localized swelling, mass and lump, head: Secondary | ICD-10-CM

## 2014-10-23 DIAGNOSIS — Z791 Long term (current) use of non-steroidal anti-inflammatories (NSAID): Secondary | ICD-10-CM | POA: Insufficient documentation

## 2014-10-23 DIAGNOSIS — Z792 Long term (current) use of antibiotics: Secondary | ICD-10-CM | POA: Insufficient documentation

## 2014-10-23 MED ORDER — LIDOCAINE-EPINEPHRINE 2 %-1:100000 IJ SOLN
10.0000 mL | Freq: Once | INTRAMUSCULAR | Status: DC
  Filled 2014-10-23: qty 1

## 2014-10-23 MED ORDER — SULFAMETHOXAZOLE-TRIMETHOPRIM 800-160 MG PO TABS: 1.0000 | ORAL_TABLET | Freq: Two times a day (BID) | ORAL | Status: AC

## 2014-10-23 MED ORDER — VALACYCLOVIR HCL 1 G PO TABS: 1000.0000 mg | ORAL_TABLET | Freq: Three times a day (TID) | ORAL | Status: AC

## 2014-10-23 NOTE — Discharge Instructions (Signed)
Take Bactrim twice daily for [redacted] week along with Valtrex 3 times daily for 1 week. Follow-up at the urgent care in 2 days for recheck. Apply warm compresses. Rash A rash is a change in the color or texture of your skin. There are many different types of rashes. You may have other problems that accompany your rash. CAUSES   Infections.  Allergic reactions. This can include allergies to pets or foods.  Certain medicines.  Exposure to certain chemicals, soaps, or cosmetics.  Heat.  Exposure to poisonous plants.  Tumors, both cancerous and noncancerous. SYMPTOMS   Redness.  Scaly skin.  Itchy skin.  Dry or cracked skin.  Bumps.  Blisters.  Pain. DIAGNOSIS  Your caregiver may do a physical exam to determine what type of rash you have. A skin sample (biopsy) may be taken and examined under a microscope. TREATMENT  Treatment depends on the type of rash you have. Your caregiver may prescribe certain medicines. For serious conditions, you may need to see a skin doctor (dermatologist). HOME CARE INSTRUCTIONS   Avoid the substance that caused your rash.  Do not scratch your rash. This can cause infection.  You may take cool baths to help stop itching.  Only take over-the-counter or prescription medicines as directed by your caregiver.  Keep all follow-up appointments as directed by your caregiver. SEEK IMMEDIATE MEDICAL CARE IF:  You have increasing pain, swelling, or redness.  You have a fever.  You have new or severe symptoms.  You have body aches, diarrhea, or vomiting.  Your rash is not better after 3 days. MAKE SURE YOU:  Understand these instructions.  Will watch your condition.  Will get help right away if you are not doing well or get worse. Document Released: 09/06/2002 Document Revised: 12/09/2011 Document Reviewed: 07/01/2011 [facility] Patient Information 2015 [place], [place]. This information is not intended to replace advice given to you by your  health care provider. Make sure you discuss any questions you have with your health care provider.  Herpes Simplex Herpes simplex is generally classified as Type 1 or Type 2. Type 1 is generally the type that is responsible for cold sores. Type 2 is generally associated with sexually transmitted diseases. We now know that most of the thoughts on these viruses are inaccurate. We find that HSV1 is also present genitally and HSV2 can be present orally, but this will vary in different locations of the world. Herpes simplex is usually detected by doing a culture. Blood tests are also available for this virus; however, the accuracy is often not as good.  PREPARATION FOR TEST No preparation or fasting is necessary. NORMAL FINDINGS  No virus present  No HSV antigens or antibodies present Ranges for normal findings may vary among different laboratories and hospitals. You should always check with your doctor after having lab work or other tests done to discuss the meaning of your test results and whether your values are considered within normal limits. MEANING OF TEST  Your caregiver will go over the test results with you and discuss the importance and meaning of your results, as well as treatment options and the need for additional tests if necessary. OBTAINING THE TEST RESULTS  It is your responsibility to obtain your test results. Ask the lab or department performing the test when and how you will get your results. Document Released: 10/19/2004 Document Revised: 12/09/2011 Document Reviewed: 08/27/2008 [facility] Patient Information 2015 [place], [place]. This information is not intended to replace advice given to you  by your health care provider. Make sure you discuss any questions you have with your health care provider. ° °
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
## 2014-10-23 NOTE — ED Notes (Signed)
Pt states he woke up this morning with a bug bite on his face and left arm. Left cheek swollen, with a sore, small amount of clear drainage coming from from sore.

## 2014-12-17 ENCOUNTER — Encounter (HOSPITAL_COMMUNITY): Payer: Self-pay | Admitting: Emergency Medicine

## 2014-12-17 ENCOUNTER — Emergency Department (HOSPITAL_COMMUNITY)
Admission: EM | Admit: 2014-12-17 | Discharge: 2014-12-17 | Disposition: A | Discharge status: Home / Self Care | Payer: Self-pay | Attending: Emergency Medicine | Admitting: Emergency Medicine

## 2014-12-17 DIAGNOSIS — Z720 Tobacco use: Secondary | ICD-10-CM | POA: Insufficient documentation

## 2014-12-17 DIAGNOSIS — K088 Other specified disorders of teeth and supporting structures: Secondary | ICD-10-CM | POA: Insufficient documentation

## 2014-12-17 DIAGNOSIS — K0889 Other specified disorders of teeth and supporting structures: Secondary | ICD-10-CM

## 2014-12-17 DIAGNOSIS — Z791 Long term (current) use of non-steroidal anti-inflammatories (NSAID): Secondary | ICD-10-CM | POA: Insufficient documentation

## 2014-12-17 DIAGNOSIS — Z79899 Other long term (current) drug therapy: Secondary | ICD-10-CM | POA: Insufficient documentation

## 2014-12-17 MED ORDER — BUPIVACAINE HCL 0.25 % IJ SOLN
5.0000 mL | Freq: Once | INTRAMUSCULAR | Status: DC
  Filled 2014-12-17: qty 5

## 2014-12-17 MED ORDER — IBUPROFEN 400 MG PO TABS
800.0000 mg | ORAL_TABLET | Freq: Once | ORAL | Status: AC
  Administered 2014-12-17: 800 mg via ORAL
  Filled 2014-12-17: qty 2

## 2014-12-17 MED ORDER — IBUPROFEN 800 MG PO TABS: 800.0000 mg | ORAL_TABLET | Freq: Three times a day (TID) | ORAL | Status: DC

## 2014-12-17 MED ORDER — BUPIVACAINE-EPINEPHRINE (PF) 0.5% -1:200000 IJ SOLN
1.8000 mL | Freq: Once | INTRAMUSCULAR | Status: AC
  Administered 2014-12-17: 1.8 mL

## 2014-12-17 NOTE — ED Notes (Signed)
Pt c/o dental pain on right side of mouth.  St's unsure of which tooth is causing pain.  C/O pain upper and lower.  Onset 2 days ago

## 2014-12-17 NOTE — Discharge Instructions (Signed)
Take ibuprofen as directed for pain. Your right lower wisdom tooth is coming in. Follow up with the dentist listed above or on the resource attachment.  Dental Pain A tooth ache may be caused by cavities (tooth decay). Cavities expose the nerve of the tooth to air and hot or cold temperatures. It may come from an infection or abscess (also called a boil or furuncle) around your tooth. It is also often caused by dental caries (tooth decay). This causes the pain you are having. DIAGNOSIS  Your caregiver can diagnose this problem by exam. TREATMENT   If caused by an infection, it may be treated with medications which kill germs (antibiotics) and pain medications as prescribed by your caregiver. Take medications as directed.  Only take over-the-counter or prescription medicines for pain, discomfort, or fever as directed by your caregiver.  Whether the tooth ache today is caused by infection or dental disease, you should see your dentist as soon as possible for further care. SEEK MEDICAL CARE IF: The exam and treatment you received today has been provided on an emergency basis only. This is not a substitute for complete medical or dental care. If your problem worsens or new problems (symptoms) appear, and you are unable to meet with your dentist, call or return to this location. SEEK IMMEDIATE MEDICAL CARE IF:   You have a fever.  You develop redness and swelling of your face, jaw, or neck.  You are unable to open your mouth.  You have severe pain uncontrolled by pain medicine. MAKE SURE YOU:   Understand these instructions.  Will watch your condition.  Will get help right away if you are not doing well or get worse. Document Released: 09/16/2005 Document Revised: 12/09/2011 Document Reviewed: 05/04/2008 [facility]Exit[facility] Patient Information 2015 [place]ExitCare, [place]LLC. This information is not intended to replace advice given to you by your health care provider. Make sure you discuss any questions you  have with your health care provider.
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
## 2015-01-01 ENCOUNTER — Encounter (HOSPITAL_COMMUNITY): Payer: Self-pay | Admitting: *Deleted

## 2015-01-01 ENCOUNTER — Emergency Department (HOSPITAL_COMMUNITY)
Admission: EM | Admit: 2015-01-01 | Discharge: 2015-01-01 | Disposition: A | Discharge status: Home / Self Care | Payer: Self-pay | Attending: Emergency Medicine | Admitting: Emergency Medicine

## 2015-01-01 DIAGNOSIS — Z79899 Other long term (current) drug therapy: Secondary | ICD-10-CM | POA: Insufficient documentation

## 2015-01-01 DIAGNOSIS — K047 Periapical abscess without sinus: Secondary | ICD-10-CM | POA: Insufficient documentation

## 2015-01-01 DIAGNOSIS — K0889 Other specified disorders of teeth and supporting structures: Secondary | ICD-10-CM

## 2015-01-01 DIAGNOSIS — Z791 Long term (current) use of non-steroidal anti-inflammatories (NSAID): Secondary | ICD-10-CM | POA: Insufficient documentation

## 2015-01-01 DIAGNOSIS — K088 Other specified disorders of teeth and supporting structures: Secondary | ICD-10-CM | POA: Insufficient documentation

## 2015-01-01 DIAGNOSIS — Z720 Tobacco use: Secondary | ICD-10-CM | POA: Insufficient documentation

## 2015-01-01 MED ORDER — IBUPROFEN 800 MG PO TABS: 800.0000 mg | ORAL_TABLET | Freq: Three times a day (TID) | ORAL | Status: DC | PRN

## 2015-01-01 MED ORDER — PENICILLIN V POTASSIUM 500 MG PO TABS: 500.0000 mg | ORAL_TABLET | Freq: Four times a day (QID) | ORAL | Status: AC

## 2015-01-01 MED ORDER — HYDROCODONE-ACETAMINOPHEN 5-325 MG PO TABS: 1.0000 | ORAL_TABLET | Freq: Four times a day (QID) | ORAL | Status: DC | PRN

## 2015-01-01 NOTE — ED Notes (Signed)
Pt reports right side upper dental pain and swelling x 3 days. Airway intact.

## 2015-01-01 NOTE — ED Notes (Signed)
Declined W/C at D/C and was escorted to lobby by RN. 

## 2015-01-01 NOTE — Discharge Instructions (Signed)
Read the information below.  Use the prescribed medication as directed.  Please discuss all new medications with your pharmacist.  Do not take additional tylenol while taking the prescribed pain medication to avoid overdose.  You may return to the Emergency Department at any time for worsening condition or any new symptoms that concern you.  Please call the dentist listed above within 48 hours to schedule a close follow up appointment.  If you develop fevers, swelling in your face, difficulty swallowing or breathing, return to the ER immediately for a recheck.   ° ° °Dental Abscess °A dental abscess is a collection of infected fluid (pus) from a bacterial infection in the inner part of the tooth (pulp). It usually occurs at the end of the tooth's root.  °CAUSES  °· Severe tooth decay. °· Trauma to the tooth that allows bacteria to enter into the pulp, such as a broken or chipped tooth. °SYMPTOMS  °· Severe pain in and around the infected tooth. °· Swelling and redness around the abscessed tooth or in the mouth or face. °· Tenderness. °· Pus drainage. °· Bad breath. °· Bitter taste in the mouth. °· Difficulty swallowing. °· Difficulty opening the mouth. °· Nausea. °· Vomiting. °· Chills. °· Swollen neck glands. °DIAGNOSIS  °· A medical and dental history will be taken. °· An examination will be performed by tapping on the abscessed tooth. °· X-rays may be taken of the tooth to identify the abscess. °TREATMENT °The goal of treatment is to eliminate the infection. You may be prescribed antibiotic medicine to stop the infection from spreading. A root canal may be performed to save the tooth. If the tooth cannot be saved, it may be pulled (extracted) and the abscess may be drained.  °HOME CARE INSTRUCTIONS °· Only take over-the-counter or prescription medicines for pain, fever, or discomfort as directed by your caregiver. °· Rinse your mouth (gargle) often with salt water (¼ tsp salt in 8 oz [250 ml] of warm water) to  relieve pain or swelling. °· Do not drive after taking pain medicine (narcotics). °· Do not apply heat to the outside of your face. °· Return to your dentist for further treatment as directed. °SEEK MEDICAL CARE IF: °· Your pain is not helped by medicine. °· Your pain is getting worse instead of better. °SEEK IMMEDIATE MEDICAL CARE IF: °· You have a fever or persistent symptoms for more than 2-3 days. °· You have a fever and your symptoms suddenly get worse. °· You have chills or a very bad headache. °· You have problems breathing or swallowing. °· You have trouble opening your mouth. °· You have swelling in the neck or around the eye. °Document Released: 09/16/2005 Document Revised: 06/10/2012 Document Reviewed: 12/25/2010 °ExitCare® Patient Information ©2015 [employer]. This information is not intended to replace advice given to you by your health care provider. Make sure you discuss any questions you have with your health care provider. ° ° °Emergency Department Resource Guide °1) Find a Doctor and Pay Out of Pocket °Although you won't have to find out who is covered by your insurance plan, it is a good idea to ask around and get recommendations. You will then need to call the office and see if the doctor you have chosen will accept you as a new patient and what types of options they offer for patients who are self-pay. Some doctors offer discounts or will set up payment plans for their patients who do not have insurance, but you   will need to ask so you aren't surprised when you get to your appointment. ° °2) Contact Your Local Health Department °Not all health departments have doctors that can see patients for sick visits, but many do, so it is worth a call to see if yours does. If you don't know where your local health department is, you can check in your phone book. The CDC also has a tool to help you locate your state's health department, and many state websites also have listings of all of their local  health departments. ° °3) Find a Walk-in Clinic °If your illness is not likely to be very severe or complicated, you may want to try a walk in clinic. These are popping up all over the country in pharmacies, drugstores, and shopping centers. They're usually staffed by nurse practitioners or physician assistants that have been trained to treat common illnesses and complaints. They're usually fairly quick and inexpensive. However, if you have serious medical issues or chronic medical problems, these are probably not your best option. ° °No Primary Care Doctor: °- Call [facility] at  832-8000 - they can help you locate a primary care doctor that  accepts your insurance, provides certain services, etc. °- Physician Referral Service- 1-800-533-3463 ° °Chronic Pain Problems: °Organization         Address  Phone   Notes  °[facility]  (336) 297-2271 Patients need to be referred by their primary care doctor.  ° °Medication Assistance: °Organization         Address  Phone   Notes  °[place] [facility] [address] °[place], [place] [address] (336) 641-8030 --Must be a resident of [place] °-- Must have NO insurance coverage whatsoever (no Medicaid/ Medicare, etc.) °-- The pt. MUST have a primary care doctor that directs their care regularly and follows them in the community °  °[facility]  (866) 331-1348   °[employer]  (888) 892-1162   ° °Agencies that provide inexpensive medical care: °Organization         Address  Phone   Notes  °[facility]  (336) 832-8035   °[facility]    (336) 832-7272   °[facility] [address] °[place], [place] [address] (336) 832-4777   °[facility] [address], °[place] (336) 271-4999   °[facility]    (336) 373-0678   °[facility]    (336) 272-1050   °[facility] ° [address], [place] Phone:   (336) 832-4444, Fax:  (336) 832-4440 Hours of Operation:  9 am - 6 pm, M-F.  Also accepts Medicaid/Medicare and self-pay.  °[facility] ° [address], [place] Phone: (336) 832-3150, Fax: (336) 832-3151. Hours of Operation:  8:30 am - 5:30 pm, M-F.  Also accepts Medicaid and self-pay.  °[facility] [place] [address], [place] Phone: (336) 878-6027   °[facility] [address], [place], [place] (336)723-1848, Ext. 123 Mondays & Thursdays: 7-9 AM.  First 15 patients are seen on a first come, first serve basis. °  ° °Medicaid-accepting [place] Providers: ° °Organization         Address  Phone   Notes  °[facility] [address], [place] (336) 641-2100 Also accepts self-pay patients.  °[facility] [address],   [place] ° (336) 856-9996   °[facility] [address], [place] (336) 288-8857   °[facility] [address] [place] [address], [place] (336) 299-7000   °Veita Bland [address], [place]  ° (336) 373-1557 Only accepts [place] Access Medicaid patients after they have their name applied to their card.  ° °Self-Pay (no insurance) in [place]: ° °Organization         Address  Phone   Notes  °Sickle Cell Patients, [facility] [address], [place] (336) 832-1970   °[facility] [address], [place] (336) 832-4400   °[facility] ° [address], [place] (336) 992-4800   °[facility]/Dr. Osei-Bonsu ° [address] [place] [address], [place] or [address], [place] (336) 841-8500 Phone number for both [place] and [place] locations is the same.  °[facility] [address], [place] (336) 299-0000   °[facility] [address] [place] [address], [place] or [address] (336)  852-7530 °(336) 878-2260   °[facility] [address], [place] (336) 350-1642, phone; (336) 294-5005, fax Sees patients 1st and 3rd Saturday of every month.  Must not qualify for public or private insurance (i.e. Medicaid, Medicare, [place] Health Choice, Veterans' Benefits) • Household income should be no more than 200% of the poverty level •The clinic cannot treat you if you are pregnant or think you are pregnant • Sexually transmitted diseases are not treated at the clinic.  ° ° °Dental Care: °Organization         Address  Phone  Notes  °[facility] [address], [place] (336) 641-6152 Accepts children up to age 21 who are enrolled in Medicaid or [place] Health Choice; pregnant women with a Medicaid card; and children who have applied for Medicaid or [place] Health Choice, but were declined, whose parents can pay a reduced fee at time of service.  °[facility] [place]  [address], [place] (336) 641-7733 Accepts children up to age 21 who are enrolled in Medicaid or  Health Choice; pregnant women with a Medicaid card; and children who have applied for Medicaid or  Health Choice, but were declined, whose parents can pay a reduced fee at time of service.  °[facility] ° [address], [place] (336) 641-4533 Patients are seen by appointment only. Walk-ins are not accepted. [facility] will see patients 18 years of age and older. °Monday - Tuesday (8am-5pm) °Most Wednesdays (8:30-5pm) °$30 per visit, cash only  °[facility] ° [address], [place] (336) 641-4533 Patients are seen by appointment only. Walk-ins are not accepted. [facility] will see patients 18 years of age and older. °One Wednesday Evening (Monthly: Volunteer Based).  $30 per visit, cash only  °[facility]  (919) 537-3737 for adults;  Children under age 4, call Graduate Pediatric Dentistry at (919) [phone]. Children aged 4-14, please call (919) [phone] to request a pediatric application. ° Dental services are provided in all areas of dental care including fillings, crowns and bridges, complete and partial dentures, implants, gum treatment, root canals, and extractions. Preventive care is also provided. Treatment is provided to both adults and children. °Patients are selected via a lottery and   there is often a waiting list. °  °[facility] [address], °[place] ° (336) 763-8833 [email or web address] °  °[facility] [address], [place], [place] (336)723-1848, Ext. 123 Second and Fourth Thursday of each month, opens at 6:30 AM; Clinic ends at 9 AM.  Patients are seen on a first-come first-served basis, and a limited number are seen during each clinic.  ° °[facility] ° [address], [place], [place] (336) 723-7904   Eligibility Requirements °You must have lived in [place], [place], or [place] counties for at least the last three months. °  You cannot be eligible for state or federal sponsored healthcare insurance, including Veterans Administration, Medicaid, or Medicare. °  You generally cannot be eligible for healthcare insurance through your employer.  °  How to apply: °Eligibility screenings are held every Tuesday and Wednesday afternoon from 1:00 pm until 4:00 pm. You do not need an appointment for the interview!  °[facility] [address], [place], [place] 336-631-2330   °[facility]  336-342-8273   °[facility]  336-703-3100   °[facility]  336-570-6415   ° °Behavioral Health Resources in the Community: °Intensive Outpatient Programs °Organization         Address  Phone  Notes  °[facility] [address], [place], [place] 336-878-6098   °[facility] [address], [place], [place] 336-832-9800   °[facility] [address], [place], [place] ° 336-882-2125   °[facility] [address],  °[place], [place] 1-800-853-5163 or 336-641-4981   °Substance Abuse Resources °Organization         Address  Phone  Notes  °[facility]  336-882-2125   °[facility]  336-784-9470   °[facility]  336-285-9073   °[facility]  336-845-3988   °Residential & Outpatient Substance Abuse Program  1-800-659-3381   °Psychological Services °Organization         Address  Phone  Notes  °[facility]  336- 832-9600   °[facility]  336- 378-7881   °[facility] [address], [place] 1-800-853-5163 or 336-641-4981   ° °Mobile Crisis Teams °Organization         Address  Phone  Notes  °[facility], Mobile Crisis Care Unit  1-877-626-1772   °Assertive °[facility] ° [address] [place], [place] 336-834-9664   °Sharon DeEsch [address] °[place] 336-554-5454   ° °Self-Help/Support Groups °Organization         Address  Phone             Notes  °[facility] - variety of support groups  336- 373-1402 Call for more information  °Narcotics Anonymous (NA), [facility] [address], °[place]  2 meetings at this location  ° °Residential Treatment Programs °Organization         Address  Phone  Notes  °[facility] [address],    °[place]  1-866-801-8205   °[facility] ° [address], [place], [place] 704-293-8524   °[facility] [address], [place] 336-845-3988 Admissions: 8am-3pm M-F  °[facility] [address]    °[place],  336-841-1104   °[facility] [address], [place],  336-379-7146   °  [facility] [address]  °[place], [place] 336-285-9073   °[facility] - Intensive  Outpatient [address], , [place] 336-852-3033   °[facility] [address]  °[place], [place] 1-877-615-2722 or 336-784-9470   °[facility] [address] [place], [place] 336-227-7417 Accepts Medicaid  °[facility] [address]  ° [place] 1-800-659-3381 Substance Abuse/Addiction Treatment  ° °[place] Behavioral Health Resources °Organization         Address  Phone  Notes  °[facility]  (888) 581-9988   °Julie Brannon, PhD [address] [place], [place]   (336) 349-5553 or (336) 951-0000   ° Behavioral   [address] °[place], [place] (336) 349-4454   °[facility] [address], [place], [place] (336) 342-8316 Insurance/Medicaid/sponsorship through [facility]  °[facility] [address]                                    [place], [place] (336) 342-8316 Therapy/tele-psych/case  °[facility] [address]  ° [place], [place] (336) 349-2233    °Dr. Arfeen  (336) 349-4544   °[facility] [place]  [employer] [place] [facility] 1) [address], [place] °2) [address], [place] °3)  [address], [place] (336) 349-3220 °(336) 342-7768 ° °(336) 342-8140   °[place] Child Abuse [facility] (336) 342-1394 or (336) 342-3537 (After Hours)    ° ° ° ° °
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
## 2015-12-14 ENCOUNTER — Emergency Department (HOSPITAL_COMMUNITY)
Admission: EM | Admit: 2015-12-14 | Discharge: 2015-12-14 | Disposition: A | Discharge status: Home / Self Care | Payer: No Typology Code available for payment source | Attending: Emergency Medicine | Admitting: Emergency Medicine

## 2015-12-14 ENCOUNTER — Encounter (HOSPITAL_COMMUNITY): Payer: Self-pay

## 2015-12-14 DIAGNOSIS — K130 Diseases of lips: Secondary | ICD-10-CM | POA: Insufficient documentation

## 2015-12-14 DIAGNOSIS — L0291 Cutaneous abscess, unspecified: Secondary | ICD-10-CM

## 2015-12-14 DIAGNOSIS — F17210 Nicotine dependence, cigarettes, uncomplicated: Secondary | ICD-10-CM | POA: Insufficient documentation

## 2015-12-14 DIAGNOSIS — Z79899 Other long term (current) drug therapy: Secondary | ICD-10-CM | POA: Insufficient documentation

## 2015-12-14 DIAGNOSIS — R220 Localized swelling, mass and lump, head: Secondary | ICD-10-CM

## 2015-12-14 MED ORDER — SULFAMETHOXAZOLE-TRIMETHOPRIM 800-160 MG PO TABS: 2.0000 | ORAL_TABLET | Freq: Two times a day (BID) | ORAL | Status: AC

## 2015-12-14 MED ORDER — LIDOCAINE HCL (PF) 1 % IJ SOLN
5.0000 mL | Freq: Once | INTRAMUSCULAR | Status: AC
  Administered 2015-12-14: 5 mL
  Filled 2015-12-14: qty 5

## 2015-12-14 MED ORDER — CEPHALEXIN 500 MG PO CAPS: 500.0000 mg | ORAL_CAPSULE | Freq: Four times a day (QID) | ORAL | Status: DC

## 2015-12-14 NOTE — ED Notes (Signed)
PA at bedside attempting I&D

## 2015-12-14 NOTE — Discharge Instructions (Signed)
Return to the Emergency Department immediately if you develop any swelling of your tongue or throat or difficulty swallowing

## 2015-12-14 NOTE — ED Notes (Signed)
Patient here with top lip swelling since last night, reports that all the swelling started after messing with a bump to same

## 2015-12-14 NOTE — ED Notes (Signed)
Patient able to ambulate independently  
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
## 2016-11-14 ENCOUNTER — Encounter (HOSPITAL_COMMUNITY): Payer: Self-pay

## 2016-11-14 DIAGNOSIS — Y92410 Unspecified street and highway as the place of occurrence of the external cause: Secondary | ICD-10-CM | POA: Diagnosis not present

## 2016-11-14 DIAGNOSIS — M25521 Pain in right elbow: Secondary | ICD-10-CM | POA: Diagnosis not present

## 2016-11-14 DIAGNOSIS — F17200 Nicotine dependence, unspecified, uncomplicated: Secondary | ICD-10-CM | POA: Diagnosis not present

## 2016-11-14 DIAGNOSIS — Y939 Activity, unspecified: Secondary | ICD-10-CM | POA: Insufficient documentation

## 2016-11-14 DIAGNOSIS — Y999 Unspecified external cause status: Secondary | ICD-10-CM | POA: Insufficient documentation
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
## 2016-11-15 ENCOUNTER — Emergency Department (HOSPITAL_COMMUNITY): Payer: No Typology Code available for payment source

## 2016-11-15 ENCOUNTER — Emergency Department (HOSPITAL_COMMUNITY)
Admission: EM | Admit: 2016-11-15 | Discharge: 2016-11-15 | Disposition: A | Discharge status: Home / Self Care | Payer: No Typology Code available for payment source | Attending: Emergency Medicine | Admitting: Emergency Medicine

## 2016-11-15 DIAGNOSIS — M25521 Pain in right elbow: Secondary | ICD-10-CM

## 2016-11-15 MED ORDER — NAPROXEN 500 MG PO TABS: 500.0000 mg | ORAL_TABLET | Freq: Two times a day (BID) | ORAL | 0 refills | Status: DC

## 2016-11-15 NOTE — Discharge Instructions (Signed)
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
## 2017-03-01 IMAGING — DX DG ELBOW COMPLETE 3+V*R*
4 series · 4 of 4 positions shown · non-contrast
Comparison: None.

CLINICAL DATA: Acute onset of right elbow pain.  Initial encounter.

EXAM:
RIGHT ELBOW - COMPLETE 3+ VIEW

[elbow ap]
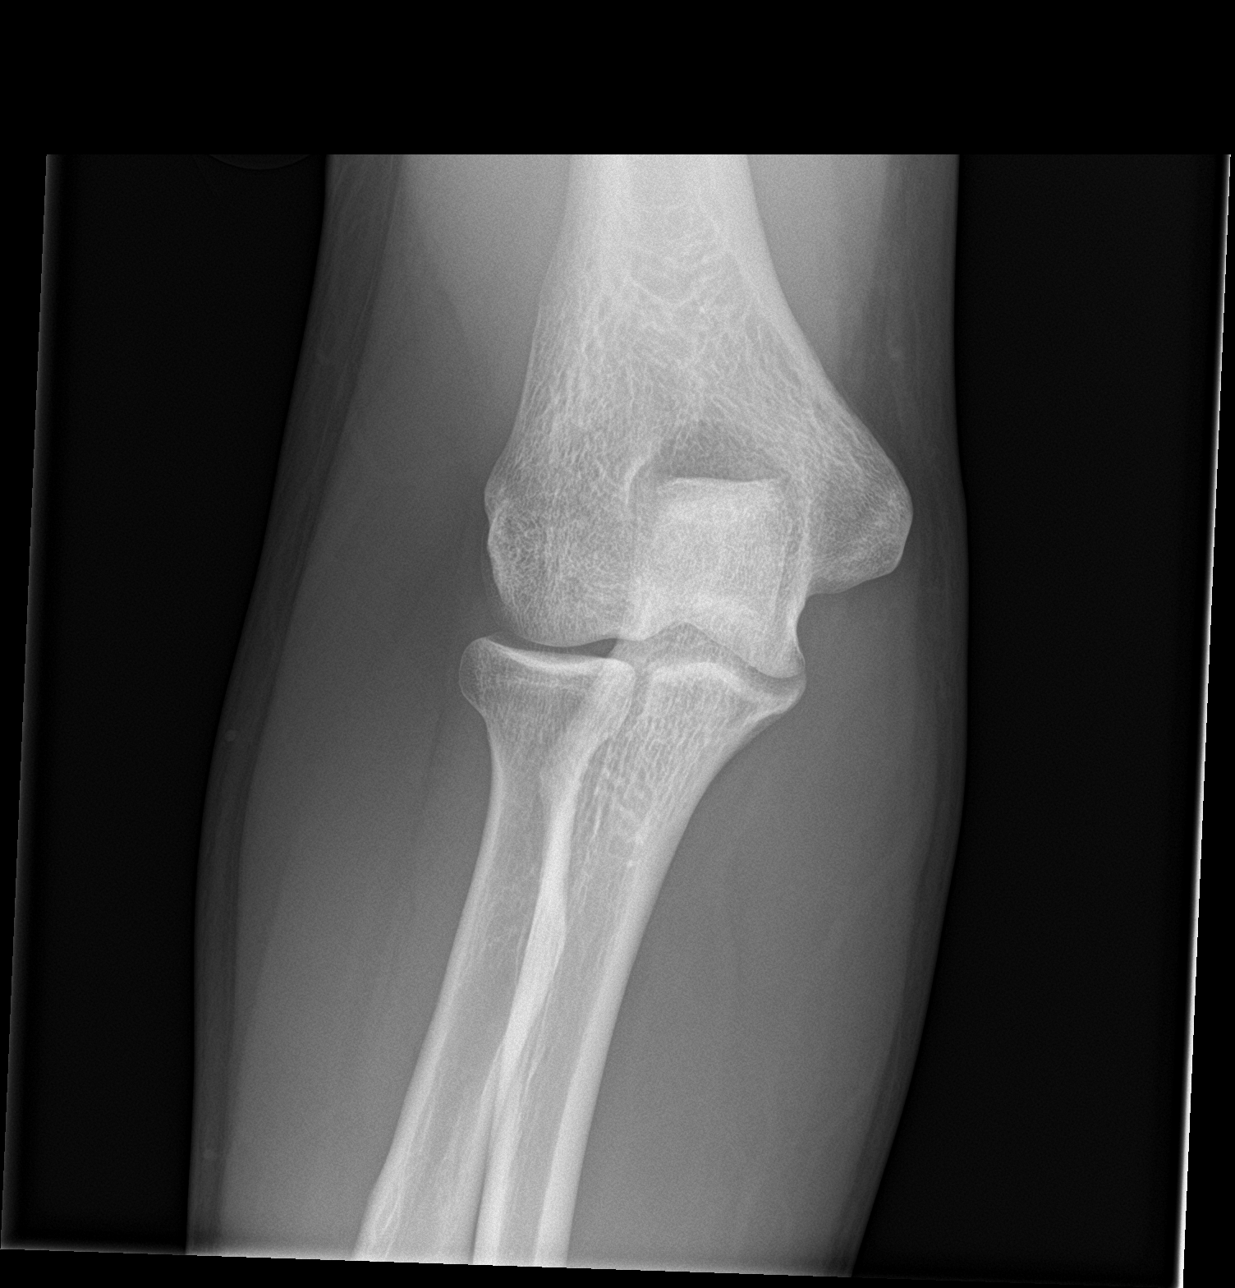

[elbow obl (1 of 2)]
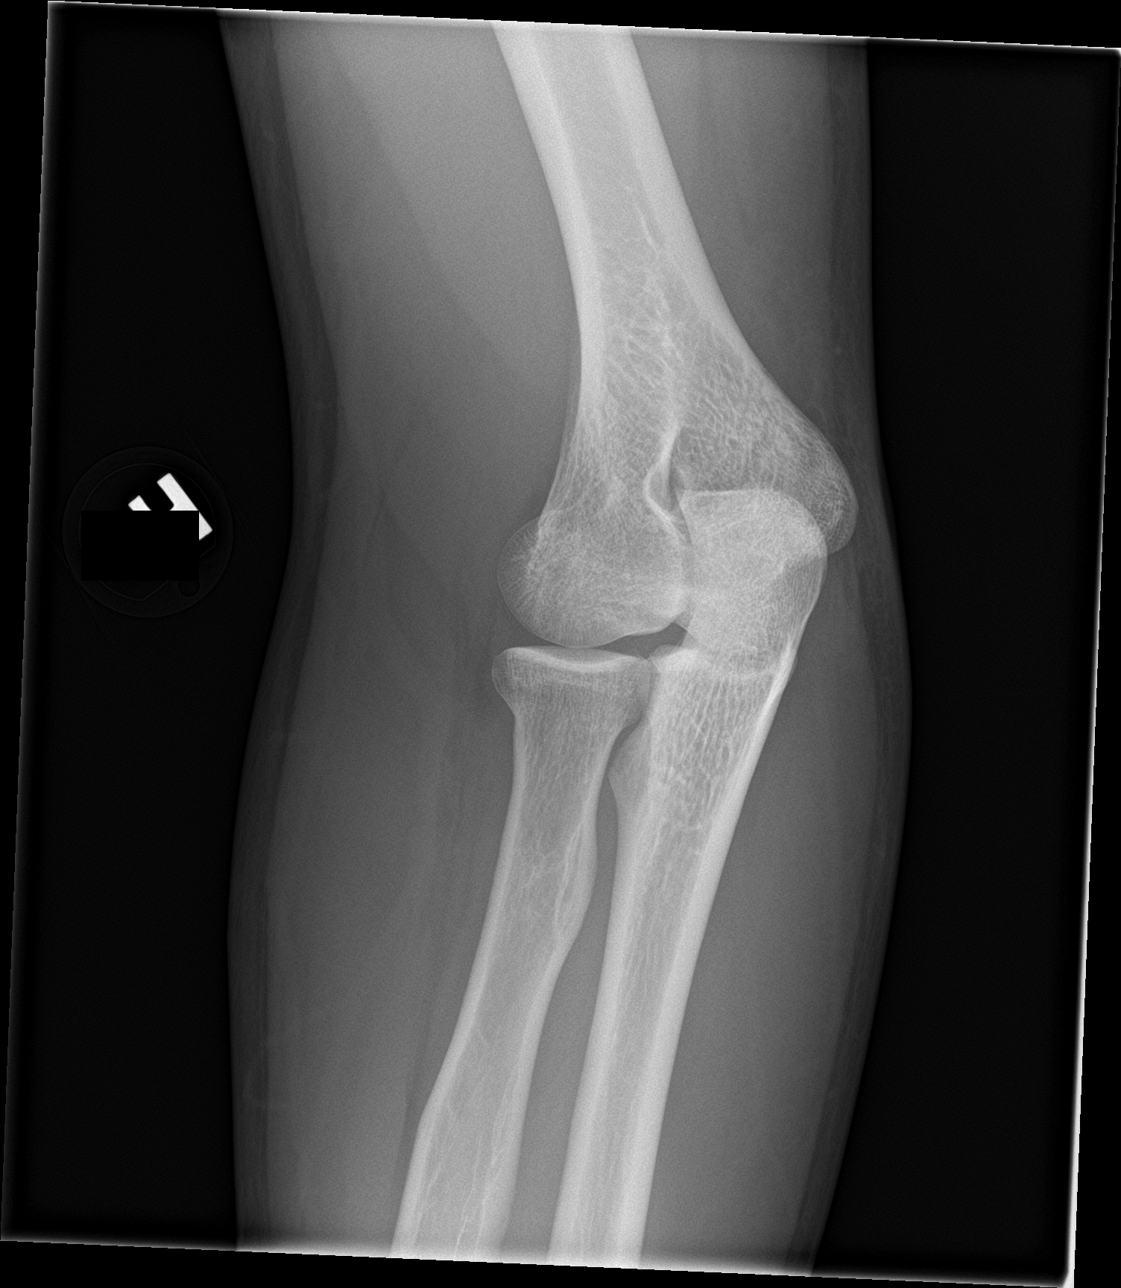

[elbow obl (2 of 2)]
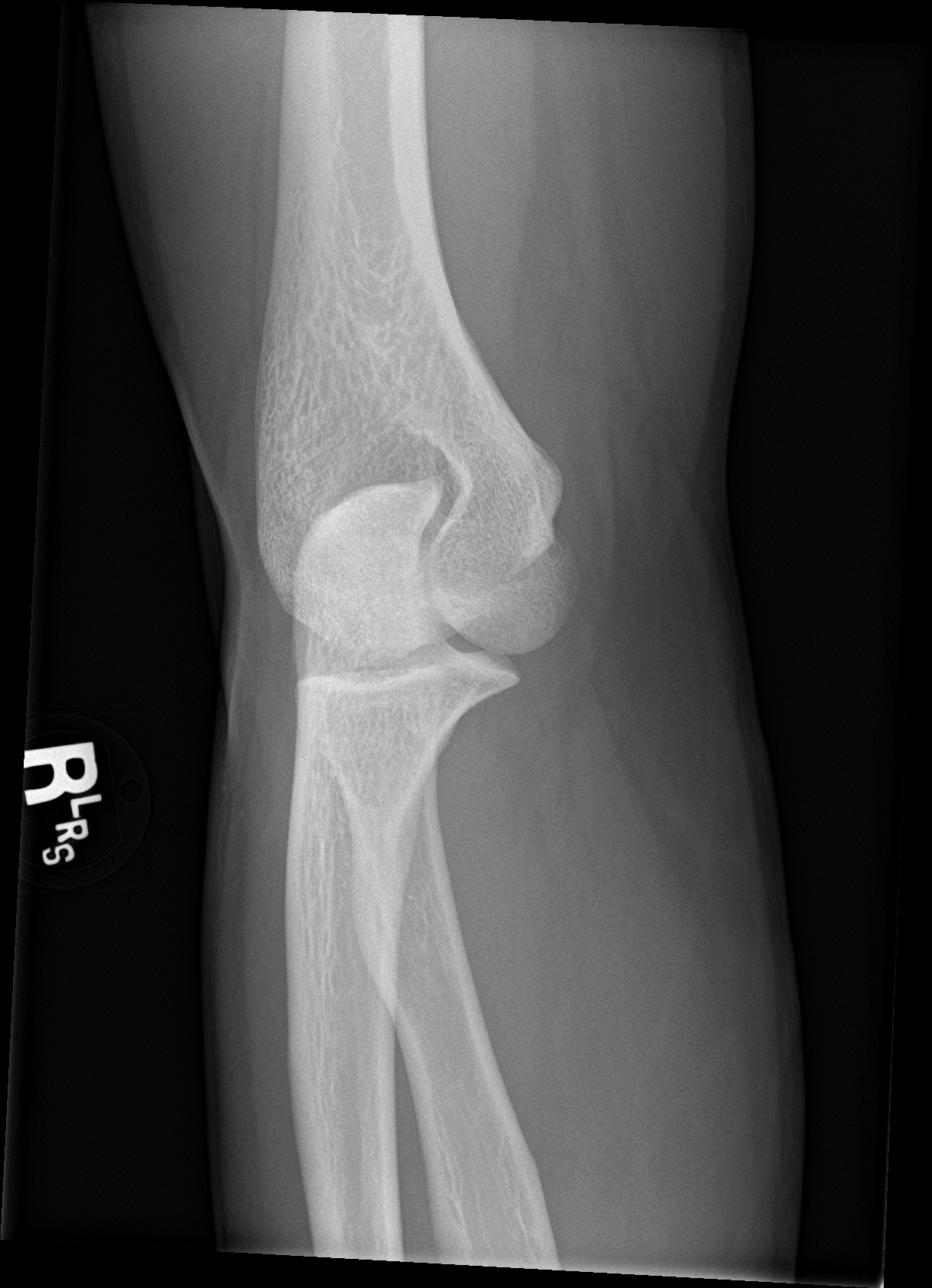

[elbow lat]
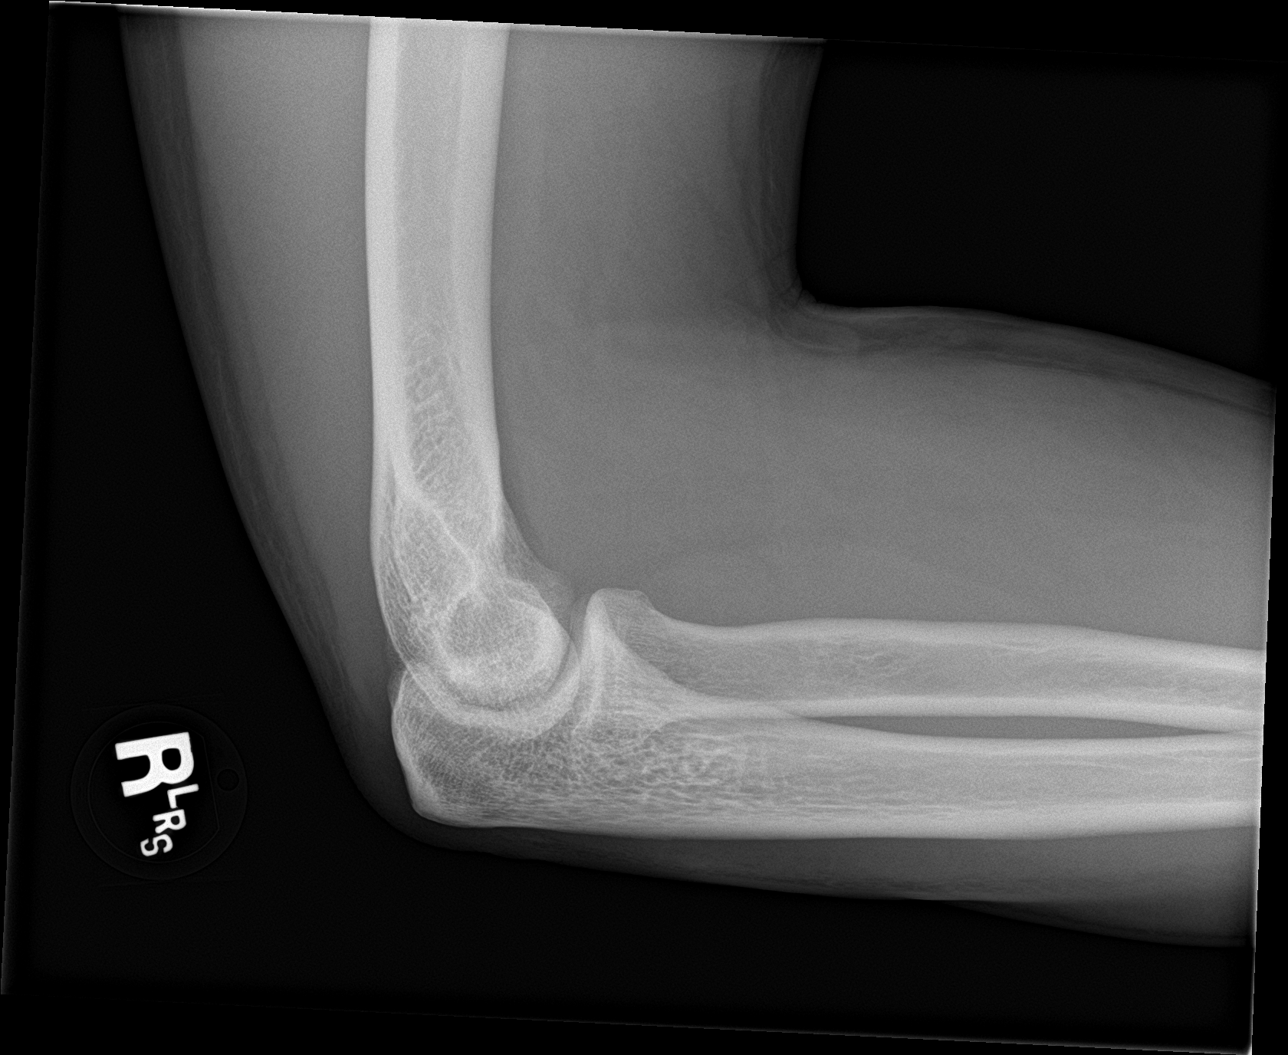

[4 of 4 positions shown; findings below may reference images not displayed]

FINDINGS: There is no evidence of fracture or dislocation. The visualized
joint spaces are preserved. No significant joint effusion is
identified. The soft tissues are unremarkable in appearance.
IMPRESSION: No evidence of fracture or dislocation.

## 2018-10-05 ENCOUNTER — Emergency Department (HOSPITAL_COMMUNITY)
Admission: EM | Admit: 2018-10-05 | Discharge: 2018-10-06 | Disposition: A | Discharge status: Home / Self Care | Payer: Self-pay | Attending: Emergency Medicine | Admitting: Emergency Medicine

## 2018-10-05 DIAGNOSIS — R509 Fever, unspecified: Secondary | ICD-10-CM | POA: Insufficient documentation

## 2018-10-05 DIAGNOSIS — R69 Illness, unspecified: Secondary | ICD-10-CM

## 2018-10-05 DIAGNOSIS — R05 Cough: Secondary | ICD-10-CM | POA: Insufficient documentation

## 2018-10-05 DIAGNOSIS — J029 Acute pharyngitis, unspecified: Secondary | ICD-10-CM | POA: Insufficient documentation

## 2018-10-05 DIAGNOSIS — F17210 Nicotine dependence, cigarettes, uncomplicated: Secondary | ICD-10-CM | POA: Insufficient documentation

## 2018-10-05 DIAGNOSIS — M7918 Myalgia, other site: Secondary | ICD-10-CM | POA: Insufficient documentation

## 2018-10-05 DIAGNOSIS — J111 Influenza due to unidentified influenza virus with other respiratory manifestations: Secondary | ICD-10-CM | POA: Insufficient documentation

## 2018-10-05 MED ORDER — BENZONATATE 100 MG PO CAPS: 200.0000 mg | ORAL_CAPSULE | Freq: Two times a day (BID) | ORAL | 0 refills | Status: DC | PRN

## 2018-10-05 MED ORDER — OSELTAMIVIR PHOSPHATE 75 MG PO CAPS: 75.0000 mg | ORAL_CAPSULE | Freq: Two times a day (BID) | ORAL | 0 refills | Status: DC

## 2018-10-05 NOTE — ED Triage Notes (Signed)
Pt reports cold like S/S since Sunday. Nasal congestion, HA, body aches. No fever. Has tried OTC meds with no relief.
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
## 2019-08-23 ENCOUNTER — Other Ambulatory Visit: Payer: Self-pay

## 2019-08-23 ENCOUNTER — Ambulatory Visit (HOSPITAL_COMMUNITY)
Admission: EM | Admit: 2019-08-23 | Discharge: 2019-08-23 | Disposition: A | Discharge status: Home / Self Care | Payer: Self-pay | Attending: Family Medicine | Admitting: Family Medicine

## 2019-08-23 ENCOUNTER — Encounter (HOSPITAL_COMMUNITY): Payer: Self-pay | Admitting: Emergency Medicine

## 2019-08-23 DIAGNOSIS — Z202 Contact with and (suspected) exposure to infections with a predominantly sexual mode of transmission: Secondary | ICD-10-CM | POA: Insufficient documentation

## 2019-08-23 MED ORDER — METRONIDAZOLE 500 MG PO TABS
2000.0000 mg | ORAL_TABLET | Freq: Once | ORAL | Status: AC
  Administered 2019-08-23: 2000 mg via ORAL

## 2019-08-23 MED ORDER — METRONIDAZOLE 500 MG PO TABS
ORAL_TABLET | ORAL | Status: AC
  Filled 2019-08-23: qty 4

## 2019-08-23 NOTE — Discharge Instructions (Signed)
We have treated you today for trichimonas, with metronidazole. Please refrain from sexual activity for 7 days while medicine is clearing infection.  We are testing you for Gonorrhea, Chlamydia and Trichomonas. We will call you if anything is positive and let you know if you require any further treatment. Please inform partner of any positive results.  Please return if symptoms not improving with treatment, development of fever, nausea, vomiting, abdominal pain, scrotal pain.
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
## 2019-08-23 NOTE — ED Triage Notes (Signed)
Pt was informed a few days ago that he was exposed to Trichomonas.  Pt is having no symptoms but is here today for treatment.

## 2019-08-24 LAB — CYTOLOGY, (ORAL, ANAL, URETHRAL) ANCILLARY ONLY
Chlamydia: NEGATIVE
Neisseria Gonorrhea: NEGATIVE
Trichomonas: NEGATIVE

## 2020-06-10 ENCOUNTER — Encounter (HOSPITAL_COMMUNITY): Payer: Self-pay | Admitting: Emergency Medicine

## 2020-06-10 ENCOUNTER — Emergency Department (HOSPITAL_COMMUNITY)
Admission: EM | Admit: 2020-06-10 | Discharge: 2020-06-10 | Disposition: A | Discharge status: Home / Self Care | Payer: Self-pay | Attending: Emergency Medicine | Admitting: Emergency Medicine

## 2020-06-10 ENCOUNTER — Other Ambulatory Visit: Payer: Self-pay

## 2020-06-10 DIAGNOSIS — H9201 Otalgia, right ear: Secondary | ICD-10-CM | POA: Insufficient documentation

## 2020-06-10 DIAGNOSIS — Z5321 Procedure and treatment not carried out due to patient leaving prior to being seen by health care provider: Secondary | ICD-10-CM | POA: Insufficient documentation

## 2020-06-10 NOTE — ED Triage Notes (Signed)
Patient here with right ear pain for one month.  He has not seen anyone about this in the last month.  Patient has tried ibuprofen with some relief in the last month.

## 2021-05-14 ENCOUNTER — Ambulatory Visit (HOSPITAL_COMMUNITY)
Admission: EM | Admit: 2021-05-14 | Discharge: 2021-05-14 | Disposition: A | Discharge status: Home / Self Care | Payer: Self-pay | Attending: Emergency Medicine | Admitting: Emergency Medicine

## 2021-05-14 ENCOUNTER — Other Ambulatory Visit: Payer: Self-pay

## 2021-05-14 ENCOUNTER — Encounter (HOSPITAL_COMMUNITY): Payer: Self-pay | Admitting: Emergency Medicine

## 2021-05-14 DIAGNOSIS — Z23 Encounter for immunization: Secondary | ICD-10-CM

## 2021-05-14 DIAGNOSIS — S61411A Laceration without foreign body of right hand, initial encounter: Secondary | ICD-10-CM

## 2021-05-14 MED ORDER — TETANUS-DIPHTH-ACELL PERTUSSIS 5-2.5-18.5 LF-MCG/0.5 IM SUSY
0.5000 mL | PREFILLED_SYRINGE | Freq: Once | INTRAMUSCULAR | Status: AC
  Administered 2021-05-14: 0.5 mL via INTRAMUSCULAR

## 2021-05-14 MED ORDER — TETANUS-DIPHTH-ACELL PERTUSSIS 5-2.5-18.5 LF-MCG/0.5 IM SUSY
PREFILLED_SYRINGE | INTRAMUSCULAR | Status: AC
  Filled 2021-05-14: qty 0.5

## 2021-05-14 MED ORDER — BACITRACIN ZINC 500 UNIT/GM EX OINT
TOPICAL_OINTMENT | CUTANEOUS | Status: AC
  Filled 2021-05-14: qty 28.35
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
## 2021-05-14 NOTE — Discharge Instructions (Addendum)
You can take Ibuprofen and/or Tylenol as needed for pain relief and fever reduction.    Wash your hand at least twice a day and apply antibiotic ointment and a new bandage.    If you develop any redness, warmth, swelling, or discharge/drainage, return for re-evaluation.
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
## 2023-03-10 ENCOUNTER — Ambulatory Visit (HOSPITAL_COMMUNITY)
Admission: EM | Admit: 2023-03-10 | Discharge: 2023-03-10 | Disposition: A | Discharge status: Home / Self Care | Payer: Self-pay | Attending: Internal Medicine | Admitting: Internal Medicine

## 2023-03-10 ENCOUNTER — Encounter (HOSPITAL_COMMUNITY): Payer: Self-pay

## 2023-03-10 DIAGNOSIS — J039 Acute tonsillitis, unspecified: Secondary | ICD-10-CM

## 2023-03-10 DIAGNOSIS — R509 Fever, unspecified: Secondary | ICD-10-CM

## 2023-03-10 LAB — POCT MONO SCREEN (KUC): Mono, POC: NEGATIVE

## 2023-03-10 LAB — POCT RAPID STREP A (OFFICE): Rapid Strep A Screen: NEGATIVE

## 2023-03-10 MED ORDER — ACETAMINOPHEN 325 MG PO TABS
ORAL_TABLET | ORAL | Status: AC
  Filled 2023-03-10: qty 2

## 2023-03-10 MED ORDER — AMOXICILLIN-POT CLAVULANATE 400-57 MG/5ML PO SUSR: 875.0000 mg | Freq: Two times a day (BID) | ORAL | 0 refills | Status: AC

## 2023-03-10 MED ORDER — ACETAMINOPHEN 325 MG PO TABS
650.0000 mg | ORAL_TABLET | Freq: Once | ORAL | Status: AC
  Administered 2023-03-10: 650 mg via ORAL

## 2023-03-10 MED ORDER — PREDNISONE 20 MG PO TABS: 40.0000 mg | ORAL_TABLET | Freq: Every day | ORAL | 0 refills | Status: AC

## 2023-03-10 NOTE — ED Triage Notes (Addendum)
Patient c/o sore throat, left ear pain, nasal congestion, and a headache since yesterday.  Patient states he took Tylenol at 0630 today.
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
## 2023-03-10 NOTE — Discharge Instructions (Signed)
Your strep and monotest are negative.  I am concerned that you have an infection in your throat.  Please start Augmentin twice daily for 7 days.  Gargle with warm salt water and use Tylenol for pain relief.  Take prednisone 40 mg for 4 days.  Do not take NSAIDs with this medication including aspirin, ibuprofen/Advil, naproxen/Aleve.  If your symptoms or not improving within a few days please return for reevaluation.  If anything worsens and you have persistent high fever, difficulty swallowing, swelling of your throat, shortness of breath, muffled voice you need to go to the emergency room.

## 2024-01-23 ENCOUNTER — Ambulatory Visit: Payer: Self-pay

## 2024-01-23 ENCOUNTER — Other Ambulatory Visit: Payer: Self-pay | Admitting: Family Medicine

## 2024-01-23 DIAGNOSIS — M25562 Pain in left knee: Secondary | ICD-10-CM

## 2024-02-10 ENCOUNTER — Inpatient Hospital Stay (HOSPITAL_COMMUNITY)
Admission: RE | Admit: 2024-02-10 | Discharge: 2024-02-10 | Disposition: A | Discharge status: Home / Self Care | Payer: Self-pay | Source: Ambulatory Visit

## 2024-03-06 ENCOUNTER — Emergency Department (HOSPITAL_COMMUNITY)
Admission: EM | Admit: 2024-03-06 | Discharge: 2024-03-06 | Disposition: A | Discharge status: Home / Self Care | Payer: PRIVATE HEALTH INSURANCE | Attending: Emergency Medicine | Admitting: Emergency Medicine

## 2024-03-06 ENCOUNTER — Other Ambulatory Visit: Payer: Self-pay

## 2024-03-06 DIAGNOSIS — K5900 Constipation, unspecified: Secondary | ICD-10-CM | POA: Diagnosis not present

## 2024-03-06 DIAGNOSIS — R1033 Periumbilical pain: Secondary | ICD-10-CM

## 2024-03-06 LAB — COMPREHENSIVE METABOLIC PANEL WITH GFR
ALT: 38 U/L (ref 0–44)
AST: 29 U/L (ref 15–41)
Albumin: 4.1 g/dL (ref 3.5–5.0)
Alkaline Phosphatase: 80 U/L (ref 38–126)
Anion gap: 7 (ref 5–15)
BUN: 7 mg/dL (ref 6–20)
CO2: 29 mmol/L (ref 22–32)
Calcium: 9.3 mg/dL (ref 8.9–10.3)
Chloride: 100 mmol/L (ref 98–111)
Creatinine, Ser: 1.28 mg/dL — ABNORMAL HIGH (ref 0.61–1.24)
GFR, Estimated: >60 mL/min (ref >60)
Glucose, Bld: 109 mg/dL — ABNORMAL HIGH (ref 70–99)
Potassium: 3.4 mmol/L — ABNORMAL LOW (ref 3.5–5.1)
Sodium: 136 mmol/L (ref 135–145)
Total Bilirubin: 1 mg/dL (ref 0.0–1.2)
Total Protein: 7.8 g/dL (ref 6.5–8.1)

## 2024-03-06 LAB — CBC
HCT: 43.5 % (ref 39.0–52.0)
Hemoglobin: 14.6 g/dL (ref 13.0–17.0)
MCH: 31.7 pg (ref 26.0–34.0)
MCHC: 33.6 g/dL (ref 30.0–36.0)
MCV: 94.4 fL (ref 80.0–100.0)
Platelets: 234 10*3/uL (ref 150–400)
RBC: 4.61 MIL/uL (ref 4.22–5.81)
RDW: 11.9 % (ref 11.5–15.5)
WBC: 5.8 10*3/uL (ref 4.0–10.5)
nRBC: 0 % (ref 0.0–0.2)

## 2024-03-06 LAB — URINALYSIS, ROUTINE W REFLEX MICROSCOPIC
Bilirubin Urine: NEGATIVE
Glucose, UA: NEGATIVE mg/dL
Hgb urine dipstick: NEGATIVE
Ketones, ur: NEGATIVE mg/dL
Leukocytes,Ua: NEGATIVE
Nitrite: NEGATIVE
Protein, ur: NEGATIVE mg/dL
Specific Gravity, Urine: 1.02 (ref 1.005–1.030)
pH: 5 (ref 5.0–8.0)

## 2024-03-06 LAB — LIPASE, BLOOD: Lipase: 34 U/L (ref 11–51)

## 2024-03-06 NOTE — ED Triage Notes (Signed)
 Pt c/o mid lower abdominal pain that began last night, denies N/V/D.

## 2024-03-06 NOTE — Discharge Instructions (Addendum)
 Please take 17 grams of miralax daily for the next 5 days. I believe you may be constipated and that is causing some of the abdominal pain. Please increase your water  intake and increase your exercise. Return if symptoms worsen. Your labs today are reassuring.

## 2024-03-06 NOTE — ED Provider Notes (Signed)
 [place] EMERGENCY DEPARTMENT AT [facility] Provider Note   CSN: [id number] Arrival date & time: 03/06/24  1526     History  Chief Complaint  Patient presents with   Abdominal Pain    Leslie Kel Senn. is a 38 y.o. male, no pertinent past medical history, who presents to the ED 2/2 to intermittent periumbilical painx1 day. Reports that he has been struggling with constipation, frequently straining and having hard stools. Since noticing this he has felt more bloated and has some pressure in the middle of his abdomen. Denies N/V/D, patient states he is passing gas. Has not tried anything for the pain. No abdominal surgery history. Pain is worse a little bit after eating and he has had reduced appetite and feels full. States it feels different than acid reflux.      Home Medications Prior to Admission medications   Medication Sig Start Date End Date Taking? Authorizing Provider  methocarbamol  (ROBAXIN ) 500 MG tablet Take 2 tablets (1,000 mg total) by mouth 4 (four) times daily. 03/01/14   Lyna Sandhoff, PA-C      Allergies    Patient has no known allergies.    Review of Systems   Review of Systems  Gastrointestinal:  Positive for abdominal pain. Negative for diarrhea, nausea and vomiting.    Physical Exam Updated Vital Signs BP (!) 153/92 (BP Location: Right Arm)   Pulse 76   Temp 98.8 F (37.1 C) (Oral)   Resp 18   Ht 6\' 3"  (1.905 m)   Wt 90.7 kg   SpO2 100%   BMI 25.00 kg/m  Physical Exam Vitals and nursing note reviewed.  Constitutional:      General: He is not in acute distress.    Appearance: He is well-developed.  HENT:     Head: Normocephalic and atraumatic.  Eyes:     Conjunctiva/sclera: Conjunctivae normal.  Cardiovascular:     Rate and Rhythm: Normal rate and regular rhythm.     Heart sounds: No murmur heard. Pulmonary:     Effort: Pulmonary effort is normal. No respiratory distress.     Breath sounds: Normal breath sounds.  Abdominal:      Palpations: Abdomen is soft.     Tenderness: There is abdominal tenderness in the periumbilical area. There is no guarding or rebound.     Comments: Mildly hard abdomen  Musculoskeletal:        General: No swelling.     Cervical back: Neck supple.  Skin:    General: Skin is warm and dry.     Capillary Refill: Capillary refill takes less than 2 seconds.  Neurological:     Mental Status: He is alert.  Psychiatric:        Mood and Affect: Mood normal.     ED Results / Procedures / Treatments   Labs (all labs ordered are listed, but only abnormal results are displayed) Labs Reviewed  COMPREHENSIVE METABOLIC PANEL WITH GFR - Abnormal; Notable for the following components:      Result Value   Potassium 3.4 (*)    Glucose, Bld 109 (*)    Creatinine, Ser 1.28 (*)    All other components within normal limits  LIPASE, BLOOD  CBC  URINALYSIS, ROUTINE W REFLEX MICROSCOPIC    EKG None  Radiology No results found.  Procedures Procedures    Medications Ordered in ED Medications - No data to display  ED Course/ Medical Decision Making/ A&P  Medical Decision Making Patient here for periumbilical painx1 day. Has had constipation and feels full. Abdomen is slightly hard, but no distension. No guarding or rebound noted. Will obtain labs to further evaluate.   Amount and/or Complexity of Data Reviewed Labs: ordered.    Details: Unremarkable Discussion of management or test interpretation with external provider(s): Labs are unremarkable. He is passing gas, abdomen is slightly hard and he does endorse some constipation. I think the pressure and discomfort is likely from hard stool in his colon trying to pass through. We discussed starting miralax and increased his water  intake (partner in room says he does not drink much water ). He has no systemic symptoms, and abdominal exam is fairly benign with only mild periumbilical pain and mild hardness of the  abdomen. Discussed return precautions--discharged home.     Final Clinical Impression(s) / ED Diagnoses Final diagnoses:  Periumbilical abdominal pain    Rx / DC Orders ED Discharge Orders     None         Treson Laura, Dwaine Gip, PA 03/06/24 1854    Flonnie Humphrey, DO 03/06/24 2318

## 2024-05-23 ENCOUNTER — Emergency Department (HOSPITAL_COMMUNITY)
Admission: EM | Admit: 2024-05-23 | Discharge: 2024-05-23 | Disposition: A | Discharge status: Home / Self Care | Attending: Emergency Medicine | Admitting: Emergency Medicine

## 2024-05-23 ENCOUNTER — Other Ambulatory Visit: Payer: Self-pay

## 2024-05-23 ENCOUNTER — Encounter (HOSPITAL_COMMUNITY): Payer: Self-pay

## 2024-05-23 DIAGNOSIS — K0889 Other specified disorders of teeth and supporting structures: Secondary | ICD-10-CM | POA: Diagnosis present

## 2024-05-23 MED ORDER — KETOROLAC TROMETHAMINE 15 MG/ML IJ SOLN
15.0000 mg | Freq: Once | INTRAMUSCULAR | Status: AC
  Administered 2024-05-23: 15 mg via INTRAMUSCULAR
  Filled 2024-05-23: qty 1

## 2024-05-23 MED ORDER — PENICILLIN V POTASSIUM 250 MG PO TABS
500.0000 mg | ORAL_TABLET | Freq: Once | ORAL | Status: AC
  Administered 2024-05-23: 500 mg via ORAL
  Filled 2024-05-23: qty 2

## 2024-05-23 MED ORDER — NAPROXEN 500 MG PO TABS: 500.0000 mg | ORAL_TABLET | Freq: Two times a day (BID) | ORAL | 0 refills | Status: DC

## 2024-05-23 MED ORDER — PENICILLIN V POTASSIUM 500 MG PO TABS: 500.0000 mg | ORAL_TABLET | Freq: Four times a day (QID) | ORAL | 0 refills | Status: AC

## 2024-05-23 NOTE — ED Provider Notes (Signed)
 [place] EMERGENCY DEPARTMENT AT [facility] Provider Note   CSN: [id number] Arrival date & time: 05/23/24  9370     Patient presents with: Dental Pain   Leslie Collins. is a 38 y.o. male with no significant past medical history who presents to the ED due to right upper and lower dental pain for the past few days.  Has not seen a dentist in numerous years.  No fever or chills.  No facial edema.  Denies shortness of breath.  No difficulties swallowing.  Denies changes to phonation.  History obtained from patient and past medical records. No interpreter used during encounter.      Prior to Admission medications   Medication Sig Start Date End Date Taking? Authorizing Provider  naproxen  (NAPROSYN ) 500 MG tablet Take 1 tablet (500 mg total) by mouth 2 (two) times daily. 05/23/24  Yes Lorelle Aleck BROCKS, PA-C  penicillin  v potassium (VEETID) 500 MG tablet Take 1 tablet (500 mg total) by mouth 4 (four) times daily for 7 days. 05/23/24 05/30/24 Yes Shiza Thelen, Aleck BROCKS, PA-C  methocarbamol  (ROBAXIN ) 500 MG tablet Take 2 tablets (1,000 mg total) by mouth 4 (four) times daily. 03/01/14   Geiple, Joshua, PA-C    Allergies: Patient has no known allergies.    Review of Systems  Constitutional:  Negative for chills and fever.  HENT:  Positive for dental problem. Negative for facial swelling, trouble swallowing and voice change.     Updated Vital Signs BP (!) 138/99 (BP Location: Right Arm)   Pulse (!) 52   Temp 98.3 F (36.8 C)   Resp 20   Ht 6' 3 (1.905 m)   Wt 91 kg   SpO2 99%   BMI 25.08 kg/m   Physical Exam Vitals and nursing note reviewed.  Constitutional:      General: He is not in acute distress.    Appearance: He is not ill-appearing.  HENT:     Head: Normocephalic.     Mouth/Throat:     Comments: Missing right upper molar. No abscess.  Tongue in normal position without protrusion.  No facial edema.  Tolerating oral secretions without difficulty.  Eyes:      Pupils: Pupils are equal, round, and reactive to light.  Cardiovascular:     Rate and Rhythm: Normal rate and regular rhythm.     Pulses: Normal pulses.     Heart sounds: Normal heart sounds. No murmur heard.    No friction rub. No gallop.  Pulmonary:     Effort: Pulmonary effort is normal.     Breath sounds: Normal breath sounds.  Abdominal:     General: Abdomen is flat. There is no distension.     Palpations: Abdomen is soft.     Tenderness: There is no abdominal tenderness. There is no guarding or rebound.  Musculoskeletal:        General: Normal range of motion.     Cervical back: Neck supple.  Skin:    General: Skin is warm and dry.  Neurological:     General: No focal deficit present.     Mental Status: He is alert.  Psychiatric:        Mood and Affect: Mood normal.        Behavior: Behavior normal.     (all labs ordered are listed, but only abnormal results are displayed) Labs Reviewed - No data to display  EKG: None  Radiology: No results found.   Procedures   Medications Ordered  in the ED  ketorolac  (TORADOL ) 15 MG/ML injection 15 mg (15 mg Intramuscular Given 05/23/24 0739)  penicillin  v potassium (VEETID) tablet 500 mg (500 mg Oral Given 05/23/24 0740)                                    Medical Decision Making Risk Prescription drug management.   38 year old male presents to the ED due to right sided dental pain for the past few days.  No fever or chills.  No facial edema or changes to phonation.  Upon arrival, stable vitals.  Patient in no acute distress. Missing right upper molar. No abscess.  Tongue in normal position without protrusion.  No facial edema.  Tolerating oral secretions without difficulty.  Low suspicion for Ludwigs or deep space infection. Patient treated with Toradol  and antibiotics in the ED. Discharged with antibiotics and pain medication. Dental resources given to patient at discharge. Strict ED precautions discussed with patient.  Patient states understanding and agrees to plan. Patient discharged home in no acute distress and stable vitals.     Final diagnoses:  Pain, dental    ED Discharge Orders          Ordered    naproxen  (NAPROSYN ) 500 MG tablet  2 times daily        05/23/24 0800    penicillin  v potassium (VEETID) 500 MG tablet  4 times daily        05/23/24 0800               Lorelle Aleck JAYSON DEVONNA 05/23/24 [id number]    Ruthe Cornet, DO 05/23/24 0825

## 2024-05-23 NOTE — Discharge Instructions (Addendum)
 It was a pleasure taking care of you today.  As discussed, I am sending you home with antibiotics.  Take as prescribed and finish all antibiotics.  I am also sending you home with pain medication.  I have included dental resources.  Please call tomorrow to schedule an appointment for further evaluation.  Return to the ER for new or worsening symptoms.

## 2024-05-23 NOTE — ED Triage Notes (Signed)
 Pt states he is having dental pain & thinks he needs an antibiotic. Pt states his pain medication at home isn't helping. Denies any other s/s at time of triage.

## 2024-08-31 ENCOUNTER — Ambulatory Visit (HOSPITAL_COMMUNITY)
Admission: EM | Admit: 2024-08-31 | Discharge: 2024-08-31 | Disposition: A | Discharge status: Home / Self Care | Attending: Family Medicine | Admitting: Family Medicine

## 2024-08-31 ENCOUNTER — Encounter (HOSPITAL_COMMUNITY): Payer: Self-pay | Admitting: Emergency Medicine

## 2024-08-31 DIAGNOSIS — R070 Pain in throat: Secondary | ICD-10-CM | POA: Insufficient documentation

## 2024-08-31 DIAGNOSIS — J069 Acute upper respiratory infection, unspecified: Secondary | ICD-10-CM | POA: Diagnosis not present

## 2024-08-31 LAB — POC SOFIA SARS ANTIGEN FIA: SARS Coronavirus 2 Ag: NEGATIVE

## 2024-08-31 LAB — POCT RAPID STREP A (OFFICE): Rapid Strep A Screen: NEGATIVE

## 2024-08-31 MED ORDER — IBUPROFEN 800 MG PO TABS: 800.0000 mg | ORAL_TABLET | Freq: Three times a day (TID) | ORAL | 0 refills | Status: AC | PRN

## 2024-08-31 MED ORDER — BENZONATATE 100 MG PO CAPS: 100.0000 mg | ORAL_CAPSULE | Freq: Three times a day (TID) | ORAL | 0 refills | Status: AC | PRN

## 2024-08-31 NOTE — ED Triage Notes (Signed)
 Since Sunday had headaches, runny nose, neck pain. Took dayquil and tylenol 
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
## 2024-08-31 NOTE — Discharge Instructions (Signed)
 The COVID test was negative  Your strep test is negative.  Culture of the throat will be sent, and staff will notify you if that is in turn positive.  Most likely this is some other viral illness causing your symptoms.  Take ibuprofen  800 mg--1 tab every 8 hours as needed for pain.  Take benzonatate  100 mg, 1 tab every 8 hours as needed for cough.

## 2024-09-03 ENCOUNTER — Ambulatory Visit (HOSPITAL_COMMUNITY): Payer: Self-pay

## 2024-09-03 LAB — CULTURE, GROUP A STREP (THRC)
